# Patient Record
Sex: Female | Born: 1955 | Race: White | Hispanic: No | Marital: Married | Smoking: Never smoker
Health system: Southern US, Community
[De-identification: ages and names within clinical notes are randomized; demographics above are authoritative.]

## PROBLEM LIST (undated history)

## (undated) DIAGNOSIS — T783XXA Angioneurotic edema, initial encounter: Secondary | ICD-10-CM

## (undated) DIAGNOSIS — M1712 Unilateral primary osteoarthritis, left knee: Secondary | ICD-10-CM

## (undated) DIAGNOSIS — R519 Headache, unspecified: Secondary | ICD-10-CM

## (undated) DIAGNOSIS — F419 Anxiety disorder, unspecified: Secondary | ICD-10-CM

## (undated) DIAGNOSIS — F329 Major depressive disorder, single episode, unspecified: Secondary | ICD-10-CM

## (undated) DIAGNOSIS — S83242A Other tear of medial meniscus, current injury, left knee, initial encounter: Secondary | ICD-10-CM

## (undated) DIAGNOSIS — K219 Gastro-esophageal reflux disease without esophagitis: Secondary | ICD-10-CM

## (undated) DIAGNOSIS — G4730 Sleep apnea, unspecified: Secondary | ICD-10-CM

## (undated) DIAGNOSIS — M1990 Unspecified osteoarthritis, unspecified site: Secondary | ICD-10-CM

## (undated) DIAGNOSIS — F32A Depression, unspecified: Secondary | ICD-10-CM

## (undated) DIAGNOSIS — R51 Headache: Secondary | ICD-10-CM

## (undated) HISTORY — PX: TUBAL LIGATION: SHX77

## (undated) HISTORY — DX: Angioneurotic edema, initial encounter: T78.3XXA

## (undated) HISTORY — PX: PLACEMENT OF BREAST IMPLANTS: SHX6334

## (undated) HISTORY — PX: TONSILLECTOMY: SUR1361

---

## 1989-08-01 HISTORY — PX: BREAST EXCISIONAL BIOPSY: SUR124

## 1994-12-01 HISTORY — PX: AUGMENTATION MAMMAPLASTY: SUR837

## 1997-12-01 DIAGNOSIS — T783XXA Angioneurotic edema, initial encounter: Secondary | ICD-10-CM

## 1997-12-01 HISTORY — DX: Angioneurotic edema, initial encounter: T78.3XXA

## 1997-12-01 HISTORY — PX: BACK SURGERY: SHX140

## 1998-05-22 ENCOUNTER — Ambulatory Visit (HOSPITAL_COMMUNITY): Admission: RE | Admit: 1998-05-22 | Discharge: 1998-05-22 | Payer: Self-pay | Admitting: Obstetrics and Gynecology

## 1999-02-27 ENCOUNTER — Ambulatory Visit (HOSPITAL_COMMUNITY): Admission: RE | Admit: 1999-02-27 | Discharge: 1999-02-27 | Payer: Self-pay | Admitting: Neurosurgery

## 1999-02-27 ENCOUNTER — Encounter: Payer: Self-pay | Admitting: Neurosurgery

## 1999-05-28 ENCOUNTER — Encounter: Payer: Self-pay | Admitting: Obstetrics and Gynecology

## 1999-05-28 ENCOUNTER — Ambulatory Visit (HOSPITAL_COMMUNITY): Admission: RE | Admit: 1999-05-28 | Discharge: 1999-05-28 | Payer: Self-pay | Admitting: Obstetrics and Gynecology

## 1999-06-26 ENCOUNTER — Other Ambulatory Visit: Admission: RE | Admit: 1999-06-26 | Discharge: 1999-06-26 | Payer: Self-pay | Admitting: Obstetrics and Gynecology

## 2000-06-16 ENCOUNTER — Encounter: Payer: Self-pay | Admitting: Obstetrics and Gynecology

## 2000-06-16 ENCOUNTER — Ambulatory Visit (HOSPITAL_COMMUNITY): Admission: RE | Admit: 2000-06-16 | Discharge: 2000-06-16 | Payer: Self-pay | Admitting: Obstetrics and Gynecology

## 2000-06-19 ENCOUNTER — Other Ambulatory Visit: Admission: RE | Admit: 2000-06-19 | Discharge: 2000-06-19 | Payer: Self-pay | Admitting: Obstetrics and Gynecology

## 2001-02-10 ENCOUNTER — Encounter: Payer: Self-pay | Admitting: Emergency Medicine

## 2001-02-10 ENCOUNTER — Emergency Department (HOSPITAL_COMMUNITY): Admission: EM | Admit: 2001-02-10 | Discharge: 2001-02-10 | Payer: Self-pay | Admitting: Emergency Medicine

## 2001-07-20 ENCOUNTER — Other Ambulatory Visit: Admission: RE | Admit: 2001-07-20 | Discharge: 2001-07-20 | Payer: Self-pay | Admitting: Obstetrics and Gynecology

## 2002-05-31 ENCOUNTER — Encounter: Payer: Self-pay | Admitting: Obstetrics and Gynecology

## 2002-05-31 ENCOUNTER — Ambulatory Visit (HOSPITAL_COMMUNITY): Admission: RE | Admit: 2002-05-31 | Discharge: 2002-05-31 | Payer: Self-pay | Admitting: Obstetrics and Gynecology

## 2002-07-21 ENCOUNTER — Other Ambulatory Visit: Admission: RE | Admit: 2002-07-21 | Discharge: 2002-07-21 | Payer: Self-pay | Admitting: Obstetrics and Gynecology

## 2003-07-13 ENCOUNTER — Encounter: Payer: Self-pay | Admitting: Obstetrics and Gynecology

## 2003-07-13 ENCOUNTER — Ambulatory Visit (HOSPITAL_COMMUNITY): Admission: RE | Admit: 2003-07-13 | Discharge: 2003-07-13 | Payer: Self-pay | Admitting: Obstetrics and Gynecology

## 2003-07-25 ENCOUNTER — Other Ambulatory Visit: Admission: RE | Admit: 2003-07-25 | Discharge: 2003-07-25 | Payer: Self-pay | Admitting: Obstetrics and Gynecology

## 2004-08-08 ENCOUNTER — Other Ambulatory Visit: Admission: RE | Admit: 2004-08-08 | Discharge: 2004-08-08 | Payer: Self-pay | Admitting: Obstetrics and Gynecology

## 2004-08-16 ENCOUNTER — Encounter: Admission: RE | Admit: 2004-08-16 | Discharge: 2004-08-16 | Payer: Self-pay | Admitting: Obstetrics and Gynecology

## 2005-03-27 ENCOUNTER — Encounter: Admission: RE | Admit: 2005-03-27 | Discharge: 2005-03-27 | Payer: Self-pay | Admitting: Obstetrics and Gynecology

## 2005-04-01 ENCOUNTER — Ambulatory Visit: Payer: Self-pay | Admitting: Internal Medicine

## 2005-07-04 ENCOUNTER — Ambulatory Visit: Payer: Self-pay | Admitting: Internal Medicine

## 2005-09-06 ENCOUNTER — Ambulatory Visit (HOSPITAL_COMMUNITY): Admission: RE | Admit: 2005-09-06 | Discharge: 2005-09-06 | Payer: Self-pay | Admitting: Orthopedic Surgery

## 2005-09-10 ENCOUNTER — Ambulatory Visit (HOSPITAL_COMMUNITY): Admission: RE | Admit: 2005-09-10 | Discharge: 2005-09-10 | Payer: Self-pay | Admitting: Obstetrics and Gynecology

## 2005-09-15 ENCOUNTER — Other Ambulatory Visit: Admission: RE | Admit: 2005-09-15 | Discharge: 2005-09-15 | Payer: Self-pay | Admitting: Obstetrics and Gynecology

## 2005-11-10 ENCOUNTER — Ambulatory Visit (HOSPITAL_COMMUNITY): Admission: RE | Admit: 2005-11-10 | Discharge: 2005-11-10 | Payer: Self-pay | Admitting: Orthopedic Surgery

## 2005-11-10 ENCOUNTER — Ambulatory Visit (HOSPITAL_BASED_OUTPATIENT_CLINIC_OR_DEPARTMENT_OTHER): Admission: RE | Admit: 2005-11-10 | Discharge: 2005-11-10 | Payer: Self-pay | Admitting: Orthopedic Surgery

## 2005-12-01 HISTORY — PX: LATERAL EPICONDYLE RELEASE: SHX1958

## 2006-07-16 ENCOUNTER — Ambulatory Visit: Payer: Self-pay | Admitting: Internal Medicine

## 2006-09-11 ENCOUNTER — Ambulatory Visit (HOSPITAL_COMMUNITY): Admission: RE | Admit: 2006-09-11 | Discharge: 2006-09-11 | Payer: Self-pay | Admitting: Obstetrics and Gynecology

## 2007-08-27 ENCOUNTER — Ambulatory Visit: Payer: Self-pay | Admitting: Internal Medicine

## 2007-09-01 ENCOUNTER — Ambulatory Visit: Payer: Self-pay | Admitting: Pulmonary Disease

## 2007-09-15 ENCOUNTER — Ambulatory Visit (HOSPITAL_COMMUNITY): Admission: RE | Admit: 2007-09-15 | Discharge: 2007-09-15 | Payer: Self-pay | Admitting: Obstetrics and Gynecology

## 2007-09-25 DIAGNOSIS — G2581 Restless legs syndrome: Secondary | ICD-10-CM | POA: Insufficient documentation

## 2007-09-25 DIAGNOSIS — J302 Other seasonal allergic rhinitis: Secondary | ICD-10-CM | POA: Insufficient documentation

## 2007-09-25 DIAGNOSIS — J4530 Mild persistent asthma, uncomplicated: Secondary | ICD-10-CM

## 2007-09-25 DIAGNOSIS — L500 Allergic urticaria: Secondary | ICD-10-CM

## 2007-11-19 ENCOUNTER — Telehealth (INDEPENDENT_AMBULATORY_CARE_PROVIDER_SITE_OTHER): Payer: Self-pay | Admitting: *Deleted

## 2007-12-02 HISTORY — PX: SHOULDER ARTHROSCOPY W/ ROTATOR CUFF REPAIR: SHX2400

## 2007-12-07 ENCOUNTER — Ambulatory Visit: Payer: Self-pay | Admitting: Internal Medicine

## 2008-01-07 ENCOUNTER — Ambulatory Visit: Payer: Self-pay | Admitting: Internal Medicine

## 2008-02-28 ENCOUNTER — Telehealth: Payer: Self-pay | Admitting: Internal Medicine

## 2008-03-02 ENCOUNTER — Telehealth (INDEPENDENT_AMBULATORY_CARE_PROVIDER_SITE_OTHER): Payer: Self-pay | Admitting: *Deleted

## 2008-03-21 ENCOUNTER — Ambulatory Visit (HOSPITAL_COMMUNITY): Admission: RE | Admit: 2008-03-21 | Discharge: 2008-03-21 | Payer: Self-pay | Admitting: Neurosurgery

## 2008-04-07 ENCOUNTER — Telehealth (INDEPENDENT_AMBULATORY_CARE_PROVIDER_SITE_OTHER): Payer: Self-pay | Admitting: *Deleted

## 2008-04-27 ENCOUNTER — Ambulatory Visit (HOSPITAL_COMMUNITY): Admission: RE | Admit: 2008-04-27 | Discharge: 2008-04-27 | Payer: Self-pay | Admitting: Orthopedic Surgery

## 2008-05-15 ENCOUNTER — Encounter: Admission: RE | Admit: 2008-05-15 | Discharge: 2008-08-13 | Payer: Self-pay | Admitting: Orthopedic Surgery

## 2008-08-25 ENCOUNTER — Ambulatory Visit: Payer: Self-pay | Admitting: Internal Medicine

## 2008-09-19 ENCOUNTER — Ambulatory Visit (HOSPITAL_COMMUNITY): Admission: RE | Admit: 2008-09-19 | Discharge: 2008-09-19 | Payer: Self-pay | Admitting: Obstetrics and Gynecology

## 2008-12-01 HISTORY — PX: BUNIONECTOMY: SHX129

## 2008-12-26 ENCOUNTER — Emergency Department (HOSPITAL_COMMUNITY): Admission: EM | Admit: 2008-12-26 | Discharge: 2008-12-26 | Payer: Self-pay | Admitting: Emergency Medicine

## 2008-12-29 ENCOUNTER — Emergency Department (HOSPITAL_COMMUNITY): Admission: EM | Admit: 2008-12-29 | Discharge: 2008-12-29 | Payer: Self-pay | Admitting: Family Medicine

## 2009-01-07 ENCOUNTER — Emergency Department (HOSPITAL_COMMUNITY): Admission: EM | Admit: 2009-01-07 | Discharge: 2009-01-07 | Payer: Self-pay | Admitting: Family Medicine

## 2009-02-02 ENCOUNTER — Ambulatory Visit (HOSPITAL_COMMUNITY): Admission: RE | Admit: 2009-02-02 | Discharge: 2009-02-02 | Payer: Self-pay | Admitting: Gastroenterology

## 2009-02-02 LAB — HM COLONOSCOPY

## 2009-03-06 ENCOUNTER — Telehealth: Payer: Self-pay | Admitting: Internal Medicine

## 2009-08-24 ENCOUNTER — Ambulatory Visit: Payer: Self-pay | Admitting: Internal Medicine

## 2009-08-30 ENCOUNTER — Encounter: Payer: Self-pay | Admitting: Internal Medicine

## 2009-09-03 ENCOUNTER — Telehealth: Payer: Self-pay | Admitting: Adult Health

## 2009-09-24 ENCOUNTER — Encounter: Payer: Self-pay | Admitting: Internal Medicine

## 2009-10-02 ENCOUNTER — Ambulatory Visit (HOSPITAL_COMMUNITY): Admission: RE | Admit: 2009-10-02 | Discharge: 2009-10-02 | Payer: Self-pay | Admitting: Obstetrics and Gynecology

## 2009-11-14 ENCOUNTER — Emergency Department (HOSPITAL_COMMUNITY): Admission: EM | Admit: 2009-11-14 | Discharge: 2009-11-14 | Payer: Self-pay | Admitting: Family Medicine

## 2009-12-01 HISTORY — PX: SHOULDER ARTHROSCOPY W/ SUBACROMIAL DECOMPRESSION AND DISTAL CLAVICLE EXCISION: SHX2401

## 2010-02-20 ENCOUNTER — Encounter: Payer: Self-pay | Admitting: Internal Medicine

## 2010-03-05 ENCOUNTER — Ambulatory Visit: Payer: Self-pay | Admitting: Internal Medicine

## 2010-03-12 ENCOUNTER — Telehealth (INDEPENDENT_AMBULATORY_CARE_PROVIDER_SITE_OTHER): Payer: Self-pay | Admitting: *Deleted

## 2010-03-18 ENCOUNTER — Ambulatory Visit: Payer: Self-pay | Admitting: Internal Medicine

## 2010-03-18 ENCOUNTER — Telehealth (INDEPENDENT_AMBULATORY_CARE_PROVIDER_SITE_OTHER): Payer: Self-pay | Admitting: *Deleted

## 2010-04-24 ENCOUNTER — Telehealth: Payer: Self-pay | Admitting: Internal Medicine

## 2010-05-14 ENCOUNTER — Ambulatory Visit: Payer: Self-pay | Admitting: Internal Medicine

## 2010-05-14 DIAGNOSIS — K219 Gastro-esophageal reflux disease without esophagitis: Secondary | ICD-10-CM

## 2010-05-31 ENCOUNTER — Telehealth (INDEPENDENT_AMBULATORY_CARE_PROVIDER_SITE_OTHER): Payer: Self-pay | Admitting: *Deleted

## 2010-06-10 ENCOUNTER — Telehealth: Payer: Self-pay | Admitting: Internal Medicine

## 2010-06-10 DIAGNOSIS — R05 Cough: Secondary | ICD-10-CM

## 2010-06-14 ENCOUNTER — Ambulatory Visit (HOSPITAL_COMMUNITY): Admission: RE | Admit: 2010-06-14 | Discharge: 2010-06-14 | Payer: Self-pay | Admitting: Internal Medicine

## 2010-07-01 ENCOUNTER — Encounter: Payer: Self-pay | Admitting: Internal Medicine

## 2010-07-01 ENCOUNTER — Ambulatory Visit (HOSPITAL_COMMUNITY): Admission: RE | Admit: 2010-07-01 | Discharge: 2010-07-01 | Payer: Self-pay | Admitting: Internal Medicine

## 2010-07-09 ENCOUNTER — Telehealth: Payer: Self-pay | Admitting: Internal Medicine

## 2010-07-23 ENCOUNTER — Ambulatory Visit (HOSPITAL_COMMUNITY): Admission: RE | Admit: 2010-07-23 | Discharge: 2010-07-23 | Payer: Self-pay | Admitting: Internal Medicine

## 2010-08-20 ENCOUNTER — Encounter: Payer: Self-pay | Admitting: Internal Medicine

## 2010-10-03 ENCOUNTER — Ambulatory Visit (HOSPITAL_COMMUNITY): Admission: RE | Admit: 2010-10-03 | Discharge: 2010-10-03 | Payer: Self-pay | Admitting: Orthopedic Surgery

## 2010-10-10 ENCOUNTER — Ambulatory Visit (HOSPITAL_COMMUNITY): Admission: RE | Admit: 2010-10-10 | Discharge: 2010-10-10 | Payer: Self-pay | Admitting: Obstetrics and Gynecology

## 2010-10-15 ENCOUNTER — Ambulatory Visit (HOSPITAL_COMMUNITY): Payer: Self-pay | Admitting: Psychiatry

## 2010-10-31 ENCOUNTER — Ambulatory Visit (HOSPITAL_COMMUNITY): Payer: Self-pay | Admitting: Marriage and Family Therapist

## 2010-11-06 ENCOUNTER — Ambulatory Visit (HOSPITAL_COMMUNITY): Payer: Self-pay | Admitting: Psychiatry

## 2010-11-18 ENCOUNTER — Ambulatory Visit (HOSPITAL_COMMUNITY): Payer: Self-pay | Admitting: Marriage and Family Therapist

## 2010-11-26 ENCOUNTER — Ambulatory Visit (HOSPITAL_COMMUNITY): Payer: Self-pay | Admitting: Psychiatry

## 2010-11-28 ENCOUNTER — Ambulatory Visit (HOSPITAL_COMMUNITY)
Admission: RE | Admit: 2010-11-28 | Discharge: 2010-11-28 | Payer: Self-pay | Source: Home / Self Care | Attending: Orthopedic Surgery | Admitting: Orthopedic Surgery

## 2010-12-22 ENCOUNTER — Encounter: Payer: Self-pay | Admitting: Internal Medicine

## 2010-12-22 ENCOUNTER — Encounter: Payer: Self-pay | Admitting: Obstetrics and Gynecology

## 2010-12-26 ENCOUNTER — Ambulatory Visit (HOSPITAL_COMMUNITY)
Admission: RE | Admit: 2010-12-26 | Discharge: 2010-12-26 | Payer: Self-pay | Source: Home / Self Care | Attending: Marriage and Family Therapist | Admitting: Marriage and Family Therapist

## 2010-12-27 NOTE — Op Note (Signed)
NAME:  [name], [name]               ACCOUNT NO.:  [id number]  MEDICAL RECORD NO.:  [id number]          PATIENT TYPE:  AMB  LOCATION:  SDS                          FACILITY:  [facility]  PHYSICIAN:  Vania Rea. Supple, M.D.  DATE OF BIRTH:  [birth year]  DATE OF PROCEDURE:  11/28/2010 DATE OF DISCHARGE:  11/28/2010                              OPERATIVE REPORT   PREOPERATIVE DIAGNOSES: 1. Left shoulder chronic impingement syndrome. 2. Left shoulder adhesive capsulitis. 3. Left shoulder acromioclavicular joint arthropathy.  POSTOPERATIVE DIAGNOSES: 1. Left shoulder chronic impingement syndrome. 2. Left shoulder adhesive capsulitis. 3. Left shoulder acromioclavicular joint arthropathy. 4. Complex labral tear. 5. Chondromalacia of the humeral head. 6. Partial articular rotator cuff tear.  PROCEDURES: 1. Left shoulder examination under anesthesia. 2. Left shoulder manipulation under anesthesia. 3. Left shoulder diagnostic arthroscopy. 4. Debridement of complex labral tear. 5. Debridement of partial articular rotator cuff tear. 6. Chondroplasty of the humeral head. 7. Arthroscopic subacromial decompression and bursectomy. 8. Arthroscopic distal clavicle resection.  SURGEON:  Vania Rea. Supple, MD  ASSISTANT:  Lucita Lora. Shuford, PA-C  ANESTHESIA:  General endotracheal as well as an interscalene block.  ESTIMATED BLOOD LOSS:  Minimal.  DRAINS:  None.  HISTORY:  [name] is a 55 year old female who has had progressively increasing left shoulder pain as well as restrictions in mobility and ongoing functional limitations refractory to attempts at conservative management.  Preoperative MRI scan does show changes consistent with adhesive capsulitis.  Due to her ongoing pain and functional limitation, she is brought to the operating at this time for planned left shoulder arthroscopy as described below.  Preoperatively, we counseled [name] on treatment options as well as risks versus  benefits thereof.  Possible surgical complications were reviewed including potential for bleeding, infection, neurovascular injury, periarticular fracture and/or dislocation, anesthetic complication and possible need for additional surgery.  She understands and accepts and agrees with our planned procedure.  PROCEDURE IN DETAIL:  After undergoing routine preop evaluation, the patient received prophylactic antibiotics and an interscalene block was established in the holding area by the Anesthesia Department.  Placed supine on the operating table, underwent smooth induction of general endotracheal anesthesia.  Turned to the right lateral decubitus position on a beanbag and appropriately padded and protected.  Left shoulder examination under anesthesia revealed approximately 160 degrees of forward elevation.  Gentle manipulation was performed with palpable and audible release of adhesions and achieved 180 degrees of forward elevation and abduction and 90 degrees of internal and external rotation.  Left arm suspended at 70 degrees of abduction with 10 pounds traction.  Left shoulder region was sterilely prepped and draped in standard fashion.  Time-out was called.  Posterior portal was established in the glenohumeral joint and anterior portal was established under direct visualization.  The glenohumeral articular surfaces were in overall good condition with the exception of area on the superior aspect of the humeral head, which showed evidence for grade 2 chondromalacia.  It is unclear whether the rotator cuff may have scarred to this area contributing to her restricted mobility prior to the manipulation.  We did perform a chondroplasty of  this area on the superior aspect of the humeral head adjacent to the rotator cuff insertion approximately 2 x 2 cm.  There was also a partial articular rotator cuff tear involving the distal supraspinatus, which corresponding to this area and this was  debrided with shaver and I would estimate corresponded to less than 5% of thickness of the tendon.  The biceps tendon itself was of normal caliber and no evidence for proximal and distal instability.  No instability patterns were noted.  There was an extensive degenerative tear of the superior labrum, anterior superior quadrant, which was debrided with shaver back to stable margin.  The remaining inspection of the glenohumeral joint showed no obvious additional intra-articular pathologies and overall, capsular volume was within normal limits.  Fluid and instruments were then removed.  The arm was dropped down to 30 degrees abduction with the arthroscope introduced into the subacromial space of the posterior portal and a direct lateral portal was established in the subacromial space.  Abundant proliferative bursal tissue was encountered and this was excised with combination of shaver and Stryker wand.  The wand was then used to remove the periosteum from the undersurface of the anterior half of the acromion and the subacromial decompression was performed with a bur removing just 2 mm of bone anteriorly.  A portal was then established directly anterior to the distal clavicle.  The [facility] joint showed significant degenerative change.  So, the distal clavicle resection was performed with a bur and care was taken to confirm visualization of the entire circumference of the distal clavicle to ensure adequate removal of bone. We then completed the subacromial/subdeltoid bursectomy.  Hemostasis was obtained.  The bursal surface of the rotator cuff was carefully inspected and found to be intact.  Final hemostasis was obtained.  Fluid and instruments were removed.  The portals were closed with Monocryl and Steri-Strips.  The bulky dry dressing was then taped at the left shoulder.  The left arm was placed in sling immobilizer.  The patient was then awakened, extubated and taken to recovery room in  stable condition.     Vania Rea. Supple, M.D.     KMS/MEDQ  D:  11/28/2010  T:  11/29/2010  Job:  [id number]  Electronically Signed by Francena Hanly M.D. on 12/25/2010 07:43:36 AM

## 2011-01-02 NOTE — Progress Notes (Signed)
Summary: Discussed Nl PFT/ Mecholyl, CT Chest/Sinus.P: SeeENT, Sched B.S.  Phone Note Call from Patient Call back at [phone]   Caller: Patient Call For: young Reason for Call: Talk to Nurse Summary of Call: pt looking for results of PFT's done at [facility], 07/01/2010.  Also, still have really bad cough.  Would like a call back to discuss these two things. Initial call taken by: Eugene Gavia,  July 09, 2010 1:42 PM  Follow-up for Phone Call        Pt requesting results of PFT. Pt states cough is still no better. Please advise. Thanks. Zackery Barefoot CMA  July 09, 2010 3:04 PM   NKDA  Additional Follow-up for Phone Call Additional follow up Details #1::        I called Mrs Cuddeback. PFT and Mecholyl studies normal/ negative, showing nothing that would explain her cough on basis of reactive airway. Cough comes and goes with no pattern of activity, exposure, in/out of doors, etc. She doesn't recognize postnasal drip reflux. I recommended ENT and barium swallow. She agrees. She has seen Dr Annalee Genta and will call him. We will schedule Bar swallow. Additional Follow-up by: Waymon Budge MD,  July 09, 2010 8:29 PM

## 2011-01-02 NOTE — Assessment & Plan Note (Signed)
Summary: rov/ mbw   Primary Provider/Referring Provider:  Royetta Asal [facility]  CC:  Follow up per pt; recently had bloody nose; allergy trouble. unable to get phelgm up  and has had sore throat and drainage. 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   08/24/09--Presents for yearly follow up - sore throat in the mornings with the season changes but otherwise no complaints.  needs new scripts for advair, allegra, proair and clonazepam. Doing well over last year. Has mild nasal drip, post nasal drainage over last few weeks. Denies chest pain, dyspnea, orthopnea, hemoptysis, fever, n/v/d, edema, headache.   March 05, 2010- Allergic rhinitis, asthma, Restless legs During winter had a sustained epistaxis and sinus drainage.Went to Urgent Care- gave mupiricn. Coughs during the night. Denies reflux. Throat tickle / itch with drainage. Neti pot sometimes helps. Eyes and ears ok.  Uses Advair daily, rescue inhaler rarely.Does use Veramyst.     Current Medications (verified): 1)  Allegra 180 Mg  Tabs (Fexofenadine Hcl) .... Once Daily 2)  Advair Diskus 250-50 Mcg/dose  Misc (Fluticasone-Salmeterol) .Marland Kitchen.. 1 Puff, Twice Daily 3)  Estrogel 0.75 Mg/1.25 Gm (0.06%) Gel (Estradiol) .... Apply Once Daily 4)  Prometrium 100 Mg  Caps (Progesterone Micronized) .... One Once Daily 5)  Clonazepam 0.5 Mg  Tabs (Clonazepam) .... One By Mouth At Bedtime 6)  Xopenex Hfa 45 Mcg/act  Aero (Levalbuterol Tartrate) .... 2 Puffs 4 X Dailyas Needed 7)  Fluoxetine Hcl 10 Mg Caps (Fluoxetine Hcl) .... 3 Capsules A Day Equaling 30mg  Daily 8)  Veramyst 27.5 Mcg/spray Susp (Fluticasone Furoate) .... One Puff Each Nostril Twice Daily 9)  Maxalt 10 Mg Tabs (Rizatriptan Benzoate) .... Take 1 By Mouth 1-2 Times A Week Migraine Onset 10)   Topamax 100 Mg Tabs (Topiramate) .... Take 300mg  At Bedtime  Allergies (verified): No Known Drug Allergies  Past History:  Past Medical History: Last updated: 01/07/2008 Allergic rhinitis- Skin test positive 1991 for common inhalants Urticaria/ angioedema 1999  Past Surgical History: Last updated: 01/07/2008 C-section  Family History: Last updated: 09/15/2008 COPD: father smoked Coronary Heart Disease arthritis   Mother-living age 85; Dem and ETOH abuse, Heart Disease. Father- living age 67; COPD, Emphysema, Arthritis. Sibling 1- living age 33; COPD , FM. Sibling 2- living age 30. Sibling 3- living age 15; Arthiritis. Sibling 4- living age 43; FM.  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  ETOH- 1 weekly  Risk Factors: Smoking Status: never (12/07/2007) Passive Smoke Exposure: yes (09/15/2008)  Review of Systems      See HPI  The patient denies anorexia, fever, weight loss, weight gain, vision loss, decreased hearing, hoarseness, chest pain, syncope, dyspnea on exertion, peripheral edema, prolonged cough, headaches, hemoptysis, abdominal pain, and severe indigestion/heartburn.    Vital Signs:  Patient profile:   55 year old female Height:      69 inches Weight:      174.13 pounds BMI:     25.81 O2 Sat:      97 % on Room air Pulse rate:   83 / minute BP sitting:   118 / 76  (left arm) Cuff size:   regular  Vitals Entered By: Reynaldo Minium CMA (March 05, 2010 3:48 PM)  O2 Flow:  Room air  Physical Exam  Additional Exam:  General: A/Ox3; pleasant and cooperative, NAD, SKIN: no rash, lesions NODES: no lymphadenopathy HEENT: [place]/AT, EOM- WNL, Conjuctivae- clear, PERRLA, TM-WNL, Nose- pale w/ clear discharge, not blocked, Throat- clear and wnl, Mallampati  II, not hoarse and not cobblestoned NECK: Supple w/ fair ROM, JVD- none, normal carotid impulses w/o bruits Thyroid- normal to  palpation CHEST: Clear to P&A HEART: RRR, no m/g/r heard ABDOMEN: Soft and nl;  UJW:JXBJ, nl pulses, no edema  NEURO: Grossly intact to observation      Impression & Recommendations:  Problem # 1:  ALLERGIC RHINITIS (ICD-477.9)  Persistent rhinitis. It began during winter and may be low grade infection rather than allergy.  We will try nasal antihistamine and antibiotic. Her updated medication list for this problem includes:    Allegra 180 Mg Tabs (Fexofenadine hcl) ..... Once daily    Veramyst 27.5 Mcg/spray Susp (Fluticasone furoate) ..... One puff each nostril twice daily  Medications Added to Medication List This Visit: 1)  Maxalt 10 Mg Tabs (Rizatriptan benzoate) .... Take 1 by mouth 1-2 times a week migraine onset 2)  Topamax 100 Mg Tabs (Topiramate) .... Take 300mg  at bedtime 3)  Cefdinir 300 Mg Caps (Cefdinir) .... 2 daily  Other Orders: Est. Patient Level II (47829)  Patient Instructions: 1)  Please schedule a follow-up appointment in 1 year. 2)  Sample Astepro nasal antihistamine spray: 1-2 sprays each nostril up to twice daily as needed. 3)  We will try sample Omnaris: 2 sprays each nostril once daily at bedtime. Then go back to Veramyst. 4)  Script for antibiotic cefdinir sent to [facility] pharmacy Prescriptions: CEFDINIR 300 MG CAPS (CEFDINIR) 2 daily  #14 x 0   Entered and Authorized by:   Waymon Budge MD   Signed by:   Waymon Budge MD on 03/05/2010   Method used:   Electronically to        [facility]* (retail)       [address]       [address] Shipping/mailing       [place], [place]  [address]       Ph: [phone]       Fax: [phone]   RxID:   [id number]

## 2011-01-02 NOTE — Assessment & Plan Note (Signed)
Summary: Acute NP office visit - asthma   Primary Provider/Referring Provider:  Royetta Asal [facility]  CC:  finished zpak saturday but states no better.  still SOB, wheezing, and prod cough with yellow/green/gray mucus x6days total.  pt deines any f/c/s.  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   08/24/09--Presents for yearly follow up - sore throat in the mornings with the season changes but otherwise no complaints.  needs new scripts for advair, allegra, proair and clonazepam. Doing well over last year. Has mild nasal drip, post nasal drainage over last few weeks. Denies chest pain, dyspnea, orthopnea, hemoptysis, fever, n/v/d, edema, headache.   March 05, 2010- Allergic rhinitis, asthma, Restless legs During winter had a sustained epistaxis and sinus drainage.Went to Urgent Care- gave mupiricn. Coughs during the night. Denies reflux. Throat tickle / itch with drainage. Neti pot sometimes helps. Eyes and ears ok.  Uses Advair daily, rescue inhaler rarely.Does use Veramyst.  March 18, 2010--Presents for an acute office visit. Complains of cough , and congestion. She  finished zpak saturday but states no better.  still SOB, wheezing, prod cough with yellow/green/gray mucus x6days total.  Has now finished omnicef and zpack but cough and wheezing persist w/ nasal congestion, stuffiness and drainage. Denies chest pain, dyspnea, orthopnea, hemoptysis, fever, n/v/d, edema, headache. Sore from coughing.    Medications Prior to Update: 1)  Allegra 180 Mg  Tabs (Fexofenadine Hcl) .... Once Daily 2)  Advair Diskus 250-50 Mcg/dose  Misc (Fluticasone-Salmeterol) .Marland Kitchen.. 1 Puff, Twice Daily 3)  Estrogel 0.75 Mg/1.25 Gm (0.06%) Gel (Estradiol) .... Apply Once Daily 4)  Prometrium 100 Mg  Caps (Progesterone Micronized)  .... One Once Daily 5)  Clonazepam 0.5 Mg  Tabs (Clonazepam) .... One By Mouth At Bedtime 6)  Xopenex Hfa 45 Mcg/act  Aero (Levalbuterol Tartrate) .... 2 Puffs 4 X Dailyas Needed 7)  Fluoxetine Hcl 10 Mg Caps (Fluoxetine Hcl) .... 3 Capsules A Day Equaling 30mg  Daily 8)  Veramyst 27.5 Mcg/spray Susp (Fluticasone Furoate) .... One Puff Each Nostril Twice Daily 9)  Maxalt 10 Mg Tabs (Rizatriptan Benzoate) .... Take 1 By Mouth 1-2 Times A Week Migraine Onset 10)  Topamax 100 Mg Tabs (Topiramate) .... Take 300mg  At Bedtime 11)  Zithromax Z-Pak 250 Mg Tabs (Azithromycin) .... 2 Today Then One Daily 12)  Promethazine-Codeine 6.25-10 Mg/42ml Syrp (Promethazine-Codeine) .Marland Kitchen.. 1 Teaspoon Four Times A Day As Needed  Current Medications (verified): 1)  Allegra 180 Mg  Tabs (Fexofenadine Hcl) .... Once Daily 2)  Advair Diskus 250-50 Mcg/dose  Misc (Fluticasone-Salmeterol) .Marland Kitchen.. 1 Puff, Twice Daily 3)  Estrogel 0.75 Mg/1.25 Gm (0.06%) Gel (Estradiol) .... Apply Once Daily 4)  Prometrium 100 Mg  Caps (Progesterone Micronized) .... One Once Daily 5)  Clonazepam 0.5 Mg  Tabs (Clonazepam) .... One By Mouth At Bedtime 6)  Xopenex Hfa 45 Mcg/act  Aero (Levalbuterol Tartrate) .... 2 Puffs 4 X Dailyas Needed 7)  Fluoxetine Hcl 10 Mg Caps (Fluoxetine Hcl) .... 3 Capsules A Day Equaling 30mg  Daily 8)  Veramyst 27.5 Mcg/spray Susp (Fluticasone Furoate) .... One Puff Each Nostril Twice Daily 9)  Maxalt 10 Mg Tabs (Rizatriptan Benzoate) .... Take 1 By Mouth 1-2 Times A Week Migraine Onset 10)  Topamax 100 Mg Tabs (Topiramate) .... Take 300mg  At Bedtime 11)  Zithromax Z-Pak 250 Mg Tabs (Azithromycin) .... 2 Today Then One  Daily 12)  Promethazine-Codeine 6.25-10 Mg/95ml Syrp (Promethazine-Codeine) .Marland Kitchen.. 1 Teaspoon Four Times A Day As Needed  Allergies (verified): No Known Drug Allergies  Past History:  Past Medical History: Last updated: 01/07/2008 Allergic rhinitis- Skin test positive 1991 for common  inhalants Urticaria/ angioedema 1999  Past Surgical History: Last updated: 01/07/2008 C-section  Family History: Last updated: 09/15/2008 COPD: father smoked Coronary Heart Disease arthritis   Mother-living age 67; Dem and ETOH abuse, Heart Disease. Father- living age 39; COPD, Emphysema, Arthritis. Sibling 1- living age 39; COPD , FM. Sibling 2- living age 19. Sibling 3- living age 31; Arthiritis. Sibling 4- living age 53; FM.  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  ETOH- 1 weekly  Risk Factors: Smoking Status: never (12/07/2007) Passive Smoke Exposure: yes (09/15/2008)  Review of Systems      See HPI  Vital Signs:  Patient profile:   55 year old female Height:      69 inches Weight:      175 pounds BMI:     25.94 O2 Sat:      97 % on Room air Temp:     99.1 degrees F oral Pulse rate:   80 / minute BP sitting:   110 / 62  (right arm) Cuff size:   regular  Vitals Entered By: Boone Master CNA (March 18, 2010 11:17 AM)  O2 Flow:  Room air CC: finished zpak saturday but states no better.  still SOB, wheezing, prod cough with yellow/green/gray mucus x6days total.  pt deines any f/c/s Is Patient Diabetic? No Comments Medications reviewed with patient Daytime contact number verified with patient. Boone Master CNA  March 18, 2010 11:18 AM    Physical Exam  Additional Exam:  General: A/Ox3; pleasant and cooperative, NAD, SKIN: no rash, lesions NODES: no lymphadenopathy HEENT: [place]/AT, EOM- WNL, Conjuctivae- clear, PERRLA, TM-WNL, Nose- pale w/ clear discharge, not blocked, Throat- clear and wnl, Mallampati  II, not hoarse and not cobblestoned NECK: Supple w/ fair ROM, JVD- none, normal carotid impulses w/o bruits Thyroid- normal to palpation CHEST: Clear to P&A HEART: RRR, no m/g/r heard ABDOMEN: Soft and nl;  ZOX:WRUE, nl pulses, no edema  NEURO: Grossly intact to  observation      Impression & Recommendations:  Problem # 1:  ASTHMA, INTERMITTENT, MILD (ICD-493.90)  Asthmatic bronchitis flare-slow to resolve.  check xray  REC: xopenex neb  Prednisone taper over next week Mucinex DM two times a day as needed cough/congestion INcrease fluids.  Saline nasal rinses as needed  Add Tylenol or Advil cold/sinus to help with sinus pressure as needed  Please contact office for sooner follow up if symptoms do not improve or worsen   Orders: T-2 View CXR (71020TC) Nebulizer Tx (45409) Est. Patient Level IV (81191)  Medications Added to Medication List This Visit: 1)  Prednisone 10 Mg Tabs (Prednisone) .... 4 tabs for 2 days, then 3 tabs for 2 days, 2 tabs for 2 days, then 1 tab for 2 days, then stop  Complete Medication List: 1)  Allegra 180 Mg Tabs (Fexofenadine hcl) .... Once daily 2)  Advair Diskus 250-50 Mcg/dose Misc (Fluticasone-salmeterol) .Marland Kitchen.. 1 puff, twice daily 3)  Estrogel 0.75 Mg/1.25 Gm (0.06%) Gel (Estradiol) .... Apply once daily 4)  Prometrium 100 Mg Caps (Progesterone micronized) .... One once daily 5)  Clonazepam 0.5 Mg Tabs (Clonazepam) .... One by mouth at bedtime 6)  Xopenex Hfa 45 Mcg/act Aero (Levalbuterol  tartrate) .... 2 puffs 4 x dailyas needed 7)  Fluoxetine Hcl 10 Mg Caps (Fluoxetine hcl) .... 3 capsules a day equaling 30mg  daily 8)  Veramyst 27.5 Mcg/spray Susp (Fluticasone furoate) .... One puff each nostril twice daily 9)  Maxalt 10 Mg Tabs (Rizatriptan benzoate) .... Take 1 by mouth 1-2 times a week migraine onset 10)  Topamax 100 Mg Tabs (Topiramate) .... Take 300mg  at bedtime 11)  Zithromax Z-pak 250 Mg Tabs (Azithromycin) .... 2 today then one daily 12)  Promethazine-codeine 6.25-10 Mg/78ml Syrp (Promethazine-codeine) .Marland Kitchen.. 1 teaspoon four times a day as needed 13)  Prednisone 10 Mg Tabs (Prednisone) .... 4 tabs for 2 days, then 3 tabs for 2 days, 2 tabs for 2 days, then 1 tab for 2 days, then stop  Patient  Instructions: 1)  I will call cell [phone]  with xray results.  2)  Prednisone taper over next week 3)  Mucinex DM two times a day as needed cough/congestion 4)  INcrease fluids.  5)  Saline nasal rinses as needed  6)  Add Tylenol or Advil cold/sinus to help with sinus pressure as needed  7)  Please contact office for sooner follow up if symptoms do not improve or worsen  Prescriptions: PREDNISONE 10 MG TABS (PREDNISONE) 4 tabs for 2 days, then 3 tabs for 2 days, 2 tabs for 2 days, then 1 tab for 2 days, then stop  #20 x 0   Entered and Authorized by:   Rubye Oaks NP   Signed by:   Rubye Oaks NP on 03/18/2010   Method used:   Electronically to        [facility]* (retail)       [address]       [address] [place] [address]/mailing       [place], [place]  [address]       Ph: [phone]       Fax: [phone]   RxID:   [id number]    Immunization History:  Influenza Immunization History:    Influenza:  historical (10/01/2009)

## 2011-01-02 NOTE — Assessment & Plan Note (Signed)
Summary: PERSISTANT COUGH///kp   Primary Provider/Referring Provider:  Royetta Asal [facility]  CC:  Persistant cough since April-non productive; tightness in chest and slight wheezing at times. .  History of Present Illness: 55 yo woman with known history of  allergic rhinitis and asthma.   08/24/09--Presents for yearly follow up - sore throat in the mornings with the season changes but otherwise no complaints.  needs new scripts for advair, allegra, proair and clonazepam. Doing well over last year. Has mild nasal drip, post nasal drainage over last few weeks. Denies chest pain, dyspnea, orthopnea, hemoptysis, fever, n/v/d, edema, headache.   March 05, 2010- Allergic rhinitis, asthma, Restless legs During winter had a sustained epistaxis and sinus drainage.Went to Urgent Care- gave mupiricn. Coughs during the night. Denies reflux. Throat tickle / itch with drainage. Neti pot sometimes helps. Eyes and ears ok.  Uses Advair daily, rescue inhaler rarely.Does use Veramyst.  March 18, 2010--Presents for an acute office visit. Complains of cough , and congestion. She  finished zpak saturday but states no better.  still SOB, wheezing, prod cough with yellow/green/gray mucus x6days total.  Has now finished omnicef and zpack but cough and wheezing persist w/ nasal congestion, stuffiness and drainage. Denies chest pain, dyspnea, orthopnea, hemoptysis, fever, n/v/d, edema, headache. Sore from coughing.   May 14, 2010- Allergic rhinitis, asthma, Restless legs. Complains of unusually long course of persistent dry cough, chest tightness with some wheeze. Started with a viral syndrome bronchitis. Benzonatate no help. Finished prednisone taper. Postnasal drip stopped. Does note reflux or something "stuck" in mid chest without food hangup.. Began Pepcid. Not using Xopenex- unhelpful.    Asthma History    Initial Asthma Severity Rating:    Age range: 12+ years    Symptoms: daily    Nighttime Awakenings:  0-2/month    Interferes w/ normal activity: minor limitations    SABA use (not for EIB): 0-2 days/week    Asthma Severity Assessment: Moderate Persistent   Preventive Screening-Counseling & Management  Alcohol-Tobacco     Smoking Status: never  Current Medications (verified): 1)  Allegra 180 Mg  Tabs (Fexofenadine Hcl) .... Once Daily 2)  Advair Diskus 250-50 Mcg/dose  Misc (Fluticasone-Salmeterol) .Marland Kitchen.. 1 Puff, Twice Daily 3)  Estrogel 0.75 Mg/1.25 Gm (0.06%) Gel (Estradiol) .... Apply Once Daily 4)  Prometrium 100 Mg  Caps (Progesterone Micronized) .... One Once Daily 5)  Clonazepam 0.5 Mg  Tabs (Clonazepam) .... One By Mouth At Bedtime 6)  Xopenex Hfa 45 Mcg/act  Aero (Levalbuterol Tartrate) .... 2 Puffs 4 X Dailyas Needed 7)  Fluoxetine Hcl 10 Mg Caps (Fluoxetine Hcl) .... 3 Capsules A Day Equaling 30mg  Daily 8)  Veramyst 27.5 Mcg/spray Susp (Fluticasone Furoate) .... One Puff Each Nostril Twice Daily 9)  Maxalt 10 Mg Tabs (Rizatriptan Benzoate) .... Take 1 By Mouth 1-2 Times A Week Migraine Onset 10)  Topamax 100 Mg Tabs (Topiramate) .... Take 300mg  At Bedtime 11)  Promethazine-Codeine 6.25-10 Mg/31ml Syrp (Promethazine-Codeine) .Marland Kitchen.. 1 Teaspoon Four Times A Day As Needed 12)  Benzonatate 200 Mg Caps (Benzonatate) .... Take 1 Capsule By Mouth Three Times A Day As Needed Cough  Allergies (verified): No Known Drug Allergies  Past History:  Past Medical History: Last updated: 01/07/2008 Allergic rhinitis- Skin test positive 1991 for common inhalants Urticaria/ angioedema 1999  Past Surgical History: Last updated: 01/07/2008 C-section  Family History: Last updated: 09/15/2008 COPD: father smoked Coronary Heart Disease arthritis   Mother-living age 61; Dem and ETOH abuse, Heart  Disease. Father- living age 18; COPD, Emphysema, Arthritis. Sibling 1- living age 75; COPD , FM. Sibling 2- living age 56. Sibling 3- living age 77; Arthiritis. Sibling 4- living age 34;  FM.  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  ETOH- 1 weekly  Risk Factors: Smoking Status: never (05/14/2010) Passive Smoke Exposure: yes (09/15/2008)  Review of Systems      See HPI       The patient complains of non-productive cough and acid heartburn.  The patient denies shortness of breath with activity, shortness of breath at rest, productive cough, coughing up blood, chest pain, irregular heartbeats, indigestion, loss of appetite, weight change, abdominal pain, difficulty swallowing, sore throat, tooth/dental problems, headaches, nasal congestion/difficulty breathing through nose, and sneezing.    Vital Signs:  Patient profile:   55 year old female Height:      69 inches Weight:      171 pounds BMI:     25.34 O2 Sat:      98 % on Room air Pulse rate:   86 / minute BP sitting:   116 / 70  (left arm) Cuff size:   regular  Vitals Entered By: Reynaldo Minium CMA (May 14, 2010 2:44 PM)  O2 Flow:  Room air CC: Persistant cough since April-non productive; tightness in chest and slight wheezing at times.    Physical Exam  Additional Exam:  General: A/Ox3; pleasant and cooperative, NAD, SKIN: no rash, lesions NODES: no lymphadenopathy HEENT: [place]/AT, EOM- WNL, Conjuctivae- clear, PERRLA, TM-WNL, Nose- pale w/ clear discharge, not blocked, Throat- clear and wnl, Mallampati  II, not hoarse and not cobblestoned NECK: Supple w/ fair ROM, JVD- none, normal carotid impulses w/o bruits Thyroid- normal to palpation CHEST: Clear to P&A, but hard cough after deep breath. HEART: RRR, no m/g/r heard ABDOMEN: Soft and nl;  UEA:VWUJ, nl pulses, no edema  NEURO: Grossly intact to observation      Impression & Recommendations:  Problem # 1:  ASTHMA, INTERMITTENT, MILD (ICD-493.90) Asthmatic bronchitis. Cough multifactorial . Will try Singulair plus increased Advair through her vacation next  week.  Problem # 2:  GERD (ICD-530.81)  We discussed this as contributory. Suggested elevating HOB and taking Pepcid two times a day before meals.  Other Orders: Est. Patient Level III (81191)  Patient Instructions: 1)  Please schedule a follow-up appointment in 3 months. 2)  Try elevating head of bed with a brick under each head leg 3)  Take Pepcid twice daily before meals 4)  Try samples Singulair one daily x 2 weeks 5)  Try samples x 2 Advair 500/50- 1 puff twice daily and rinse mouth.

## 2011-01-02 NOTE — Progress Notes (Signed)
Summary: rx LMTCB x 1  Phone Note Call from Patient Call back at [phone]   Caller: Patient Call For: young Reason for Call: Talk to Nurse Summary of Call: coughing, chest tight, wheezing, yello/green plegm, know what she has - can you call in z-pac and cough med w/ codeine in it. [facility] outpatient pharmacy Initial call taken by: Eugene Gavia,  March 12, 2010 10:23 AM  Follow-up for Phone Call        called above number - [facility] Crystal Yetta Barre RN  March 12, 2010 10:36 AM   Additional Follow-up for Phone Call Additional follow up Details #1::        Pt c/o productive cough with yellow to green mucus, wheezing, chest tightness, cough waking her up at night x 3 days, pt denies fever. Pt requesting zpack and cough medication with codeine be sent to [facility] outpt pharmacy. Please advise. Zackery Barefoot CMA  March 12, 2010 11:26 AM   NKDA    Additional Follow-up for Phone Call Additional follow up Details #2::    OK to send 1) Z pak 2) prometh codeine I have put these on her medl list. Follow-up by: Waymon Budge MD,  March 12, 2010 1:35 PM  Additional Follow-up for Phone Call Additional follow up Details #3:: Details for Additional Follow-up Action Taken: called and spoke with pt. pt aware rx sent to pharmacy. Aundra Millet Reynolds LPN  March 12, 2010 1:37 PM   New/Updated Medications: ZITHROMAX Z-PAK 250 MG TABS (AZITHROMYCIN) 2 today then one daily PROMETHAZINE-CODEINE 6.25-10 MG/5ML SYRP (PROMETHAZINE-CODEINE) 1 teaspoon four times a day as needed Prescriptions: PROMETHAZINE-CODEINE 6.25-10 MG/5ML SYRP (PROMETHAZINE-CODEINE) 1 teaspoon four times a day as needed  #200 ml x 1   Entered by:   Arman Filter LPN   Authorized by:   Waymon Budge MD   Signed by:   Arman Filter LPN on 30/16/0109   Method used:   Telephoned to ...       [facility]* (retail)       [address]       [address] Shipping/mailing       [place], [place]  [address]       Ph:  [phone]       Fax: [phone]   RxID:   [id number] ZITHROMAX Z-PAK 250 MG TABS (AZITHROMYCIN) 2 today then one daily  #1 pak x 0   Entered by:   Arman Filter LPN   Authorized by:   Waymon Budge MD   Signed by:   Arman Filter LPN on 48/54/6270   Method used:   Telephoned to ...       [facility]* (retail)       [address]       [address] Shipping/mailing       [place], [place]  [address]       Ph: [phone]       Fax: [phone]   RxID:   [id number] PROMETHAZINE-CODEINE 6.25-10 MG/5ML SYRP (PROMETHAZINE-CODEINE) 1 teaspoon four times a day as needed  #200 ml x 1   Entered by:   Waymon Budge MD   Authorized by:   Pulmonary Triage   Signed by:   Waymon Budge MD on 03/12/2010   Method used:   Historical   RxID:   [id number] ZITHROMAX Z-PAK 250 MG TABS (AZITHROMYCIN) 2 today then one daily  #1 pak x 0   Entered by:  Waymon Budge MD   Authorized by:   Pulmonary Triage   Signed by:   Waymon Budge MD on 03/12/2010   Method used:   Historical   RxID:   [id number]

## 2011-01-02 NOTE — Letter (Signed)
Summary: [facility]  [facility]   Imported By: Lester Rockford 08/26/2010 10:34:51  _____________________________________________________________________  External Attachment:    Type:   Image     Comment:   External Document

## 2011-01-02 NOTE — Progress Notes (Signed)
Summary: CONGESTED/ SOB  Phone Note Call from Patient Call back at [phone]   Caller: Patient Call For: YOUNG Summary of Call: PT STILL COUGHING SOB CONGESTED AND CHEST PAIN NEED TO KNOW IF SHE NEED TO COME IN FOR OFFICE VISIT  Initial call taken by: Rickard Patience,  March 18, 2010 10:06 AM  Follow-up for Phone Call        Pt was given zpak and prometh codeine cough syrup on 4/12 for these symptoms.  Called, spoke with pt.  Pt states she is no better.  Has finished zpak.  Still having congestion, prod cough with yellow and green mucus, wheezing, SOB at rest and with activity, has no energy, and pain in right back area.  Requesting OV.  OV scheduled with TP for today at 11:00 - pt aware.   Follow-up by: Gweneth Dimitri RN,  March 18, 2010 10:24 AM

## 2011-01-02 NOTE — Progress Notes (Signed)
Summary: cough  Phone Note Call from Patient Call back at [phone]   Caller: Patient Call For: Malayja Freund Summary of Call: pt states that cough isn't any better would like to talk to dr Arvetta Araque  Initial call taken by: Rickard Patience,  June 10, 2010 11:38 AM  Follow-up for Phone Call        pt very frustrated that cough is no better, she states coughing is just making her very concerned that something else is going on, it is constant and she is not sleeping, feeling very tired during the day.  Pt would like to speak with dr Susi Goslin directly, willing to wait until he is done with patient's  Follow-up by: Philipp Deputy CMA,  June 10, 2010 2:13 PM  Additional Follow-up for Phone Call Additional follow up Details #1::        Done Additional Follow-up by: Waymon Budge MD,  June 11, 2010 1:23 PM

## 2011-01-02 NOTE — Miscellaneous (Signed)
Summary: Clonazepam Rx   Clinical Lists Changes  Medications: Rx of CLONAZEPAM 0.5 MG  TABS (CLONAZEPAM) one by mouth at bedtime;  #90 x 0;  Signed;  Entered by: Gweneth Dimitri RN;  Authorized by: Waymon Budge MD;  Method used: Historical    Prescriptions: CLONAZEPAM 0.5 MG  TABS (CLONAZEPAM) one by mouth at bedtime  #90 x 0   Entered by:   Gweneth Dimitri RN   Authorized by:   Waymon Budge MD   Signed by:   Gweneth Dimitri RN on 02/20/2010   Method used:   Historical   RxID:   [id number]  received rx request from [facility] to refill med for pt.  Ok per Cy to refill med as stated above-#90 with no additional refills.  Per CY, refill this time only and pt needs to keep scheduled follow up appt for April.  Refill Request signed by CY and faxed back to [facility] at [phone]. Gweneth Dimitri RN  February 20, 2010 3:34 PM

## 2011-01-02 NOTE — Progress Notes (Signed)
Summary: rx   Phone Note Call from Patient Call back at [phone]   Caller: Patient Call For: young Reason for Call: Talk to Nurse Summary of Call: pt still coughing, real irritating, same symtoms as before.  Feels like she may need another round of antibiotis.  PLease respond. [facility] Initial call taken by: Eugene Gavia,  May 31, 2010 9:16 AM  Follow-up for Phone Call        [facility] .Kandice Hams CMA  May 31, 2010 10:05 AM Pt called  seen 2 wks ago given samples of singlulair and Advair 500/50, finished on tue, meds did help some but signs did not completly go away --Continous cough non productive is back, chest tightness, raspy voice, no wheezing --pt is using pepcid which is helping, elevating HOB did not help much  Please advise .Kandice Hams CMA  May 31, 2010 11:12 AM      Additional Follow-up for Phone Call Additional follow up Details #1::        Offer cough syrup: hydromet- 1 teaspoon q 6 h as needed, 250 ml, no refill. Continue Pepcid Offer sample Dulera 200/5, 2 puffs and rinse well twice daily. Offer Biaxin 500 mg, 1 after meals two times a day . # 14 Additional Follow-up by: Waymon Budge MD,  May 31, 2010 2:20 PM    Additional Follow-up for Phone Call Additional follow up Details #2::    Spoke with pt and advised that she should stay on pepcid and that we are calling in rxs for hydromet and biaxin.  Also, advised we will leave a sample of dulera for her to try 2 piffs bid.  Advised that she be sure and rinse mouth well after use.  Pt verbalized understanding. Follow-up by: Vernie Murders,  May 31, 2010 2:36 PM  New/Updated Medications: BIAXIN 500 MG TABS (CLARITHROMYCIN) 1 by mouth after meal two times a day until gone HYDROMET 5-1.5 MG/5ML SYRP (HYDROCODONE-HOMATROPINE) 1 tsp every 6 hours as needed for cough Prescriptions: HYDROMET 5-1.5 MG/5ML SYRP (HYDROCODONE-HOMATROPINE) 1 tsp every 6 hours as needed for cough  #250 x 0   Entered by:    Vernie Murders   Authorized by:   Waymon Budge MD   Signed by:   Vernie Murders on 05/31/2010   Method used:   Telephoned to ...       [facility]* (retail)       [address]       [address] Shipping/mailing       [place], [place]  [address]       Ph: [phone]       Fax: [phone]   RxID:   [id number] BIAXIN 500 MG TABS (CLARITHROMYCIN) 1 by mouth after meal two times a day until gone  #14 x 0   Entered by:   Vernie Murders   Authorized by:   Waymon Budge MD   Signed by:   Vernie Murders on 05/31/2010   Method used:   Telephoned to ...       [facility]* (retail)       [address]       [address] Shipping/mailing       [place], [place]  [address]       Ph: [phone]       Fax: [phone]   RxID:   [id number]

## 2011-01-02 NOTE — Medication Information (Signed)
Summary: Clonazepam/[facility]  Clonazepam/[facility]   Imported By: Sherian Rein 02/27/2010 10:10:48  _____________________________________________________________________  External Attachment:    Type:   Image     Comment:   External Document

## 2011-01-02 NOTE — Progress Notes (Signed)
Summary: still sick -  Phone Note Call from Patient Call back at [phone]   Caller: Patient Call For: Bracen Schum Reason for Call: Talk to Nurse Summary of Call: have a persistant cough, can't sheke.  Have some Gerd symptoms.  Got an rx for codeine cough med, but can't work with it.  Can you give her something else to take? [facility] Initial call taken by: Eugene Gavia,  Apr 24, 2010 11:08 AM  Follow-up for Phone Call        pt states she ahs been treated with 2 round of abx, prednisone and nasal sprays for bronchitis in month of April and she is much better, but she still has a chronic dry cough left that is bothering her.  She also states she is having some GERD symptoms and not sure if this is causing the cough. She has cough med that she uses at night to help with cough but she cannot use this during the day because it makes her too sleepy. Please advise. Carron Curie CMA  Apr 24, 2010 11:22 AM allergies: NKDA  Additional Follow-up for Phone Call Additional follow up Details #1::        Per CDY- try benzonatate 200mg  # 30 take 1 by mouth three times a day as needed cough no refills. Also, try aggrexive OTC antireflux like pepcid two times a day before meals.Reynaldo Minium CMA  Apr 24, 2010 2:07 PM     Additional Follow-up for Phone Call Additional follow up Details #2::    ATC pt at number provided, LMOM TCB.  benzonatate rx sent to pt's pharmacy of choice. Boone Master CNA/MA  Apr 24, 2010 2:37 PM  pt aware of recs and rx. Carron Curie CMA  Apr 25, 2010 9:23 AM    New/Updated Medications: BENZONATATE 200 MG CAPS (BENZONATATE) Take 1 capsule by mouth three times a day as needed cough Prescriptions: BENZONATATE 200 MG CAPS (BENZONATATE) Take 1 capsule by mouth three times a day as needed cough  #30 x 0   Entered by:   Boone Master CNA/MA   Authorized by:   Waymon Budge MD   Signed by:   Boone Master CNA/MA on 04/24/2010   Method used:   Electronically  to        [facility]* (retail)       [address]       [address] Shipping/mailing       [place], [place]  [address]       Ph: [phone]       Fax: [phone]   RxID:   [id number]

## 2011-01-02 NOTE — Progress Notes (Signed)
Summary: Persistent cough, no improving.  Phone Note Other Incoming   Summary of Call: She is getting frustrated that she never feels well, with persistent cough. She feels this has been going on for months. Wearing down and not getting better. Saying that, I hear no asthma, cough or wheeze tonight. She has had 10 days of Dulera without noticing beneit. I discussed and will send for second opinion if needed.  We will start wih CT chest and sinuses, PFT. Initial call taken by: Waymon Budge MD,  June 10, 2010 9:08 PM  Follow-up for Phone Call        [facility] (work) for pt to return my call to schedule CT Chest, Ltd CT of sinueses and PFT. Waiting on pt to return my call. Rhonda Cobb  June 11, 2010 12:03 PM Pt requests that all test be scheduled at [facility].  1) CT Chest with contrast and CT Sinus Ltd scheduled for Friday 06/14/10 at 12:00 arrival time. Pt to check in at 1st floor admissions. No food 4 hours prior to test. 2) Full PFT scheduled at [facility] for Monday 06/17/10 at 1:00. Pt to register at 1st floor admissions at 12:45. Instructions left on vocie mail at pt's work number along with appt date time and location. Pt advised if she had any questions, to call me back at [phone]. Rhonda Cobb  June 12, 2010 10:15 AM   New Problems: COUGH (ICD-786.2)   New Problems: COUGH (ICD-786.2)

## 2011-01-07 ENCOUNTER — Encounter (HOSPITAL_COMMUNITY): Payer: 59 | Admitting: Physician Assistant

## 2011-01-07 DIAGNOSIS — F309 Manic episode, unspecified: Secondary | ICD-10-CM

## 2011-02-05 ENCOUNTER — Encounter (HOSPITAL_COMMUNITY): Payer: 59 | Admitting: Physician Assistant

## 2011-02-10 LAB — PROTIME-INR
INR: 1.02 (ref 0.00–1.49)
Prothrombin Time: 13.6 seconds (ref 11.6–15.2)

## 2011-02-10 LAB — COMPREHENSIVE METABOLIC PANEL
ALT: 15 U/L (ref 0–35)
AST: 16 U/L (ref 0–37)
Albumin: 3.8 g/dL (ref 3.5–5.2)
CO2: 21 mEq/L (ref 19–32)
Calcium: 9.2 mg/dL (ref 8.4–10.5)
Creatinine, Ser: 0.75 mg/dL (ref 0.4–1.2)
GFR calc Af Amer: >60 mL/min (ref >60)
Sodium: 137 mEq/L (ref 135–145)
Total Bilirubin: 0.2 mg/dL — ABNORMAL LOW (ref 0.3–1.2)

## 2011-02-10 LAB — SURGICAL PCR SCREEN
MRSA, PCR: NEGATIVE
Staphylococcus aureus: NEGATIVE

## 2011-02-10 LAB — CBC
Hemoglobin: 12.7 g/dL (ref 12.0–15.0)
MCH: 28.8 pg (ref 26.0–34.0)
MCHC: 32.5 g/dL (ref 30.0–36.0)
Platelets: 327 10*3/uL (ref 150–400)
RBC: 4.41 MIL/uL (ref 3.87–5.11)

## 2011-02-10 LAB — URINALYSIS, ROUTINE W REFLEX MICROSCOPIC
Bilirubin Urine: NEGATIVE
Hgb urine dipstick: NEGATIVE
Ketones, ur: NEGATIVE mg/dL
Protein, ur: NEGATIVE mg/dL
Urobilinogen, UA: 0.2 mg/dL (ref 0.0–1.0)

## 2011-02-20 ENCOUNTER — Encounter (HOSPITAL_COMMUNITY): Payer: 59 | Admitting: Physician Assistant

## 2011-03-13 ENCOUNTER — Encounter (HOSPITAL_COMMUNITY): Payer: 59 | Admitting: Physician Assistant

## 2011-03-13 DIAGNOSIS — F329 Major depressive disorder, single episode, unspecified: Secondary | ICD-10-CM

## 2011-03-17 LAB — POCT URINALYSIS DIP (DEVICE)
Bilirubin Urine: NEGATIVE
Bilirubin Urine: NEGATIVE
Glucose, UA: NEGATIVE mg/dL
Hgb urine dipstick: NEGATIVE
Ketones, ur: NEGATIVE mg/dL
Nitrite: POSITIVE — AB
Specific Gravity, Urine: >=1.03 (ref 1.005–1.030)
pH: 7.5 (ref 5.0–8.0)

## 2011-03-17 LAB — URINE CULTURE: Colony Count: NO GROWTH

## 2011-03-18 LAB — POCT URINALYSIS DIP (DEVICE)
Bilirubin Urine: NEGATIVE
Glucose, UA: NEGATIVE mg/dL
Ketones, ur: NEGATIVE mg/dL
pH: 6 (ref 5.0–8.0)

## 2011-04-15 NOTE — Assessment & Plan Note (Signed)
[facility]                             PULMONARY OFFICE NOTE   Lauren, Nguyen                      MRN:          [id number]  DATE:09/01/2007                            DOB:          1956-07-29    HISTORY OF PRESENT ILLNESS:  The patient is a 55 year old white female  patient of Dr. Roxy Cedar who has a known history of asthma, allergic  rhinitis, who presents today for persistent cough and congestion.  The  patient was seen 5 days ago for an acute bronchitic flare.  She was  given a Z-Pak, Depo 40 mg, and Xopenex nebulizer treatment.  The patient  reports her symptoms have not improved and continues to cough up thick  mucus, and feels extremely fatigued and tired with chest tightness.  The  patient denies any exertional chest pain, palpitations, hemoptysis,  orthopnea, PND, or leg swelling.   PAST MEDICAL HISTORY:  Reviewed.   CURRENT MEDICATIONS:  Reviewed.   PHYSICAL EXAM:  The patient is a pleasant female in no acute distress.  She is afebrile with stable vital signs.  O2 saturation is 100% on room  air.  HEENT:  Nasal mucosa is with some mild erythema on the right.  Nontender  sinuses.  Conjunctivae not injected.  TMs clear normal.  Posterior  oropharynx is clear.  NECK:  Supple without cervical adenopathy.  Negative nuchal rigidity.  LUNGS:  Sounds are clear bilaterally without any wheezing or crackles.  CARDIAC:  S1, S2 without murmur, rub, or gallop.  ABDOMEN:  Soft and nontender.  No palpable hepatosplenomegaly.  EXTREMITIES:  Warm without any calf cyanosis, clubbing, or edema.   IMPRESSION AND PLAN:  Slow-to-resolve asthmatic bronchitic exacerbation.  The patient was given Xopenex nebulizer treatment today in the office.  Chest x-ray is pending.  Avelox 400 mg daily x7 days.  Recommend Mucinex  DM or Robitussin DM as needed for cough and congestion.  The patient is  to return back with Dr. Maple Hudson as scheduled or sooner if needed.   The  patient is to contact the office for sooner followup if symptoms do not  improve or worsen.      Rubye Oaks, NP  Electronically Signed      Clinton D. Maple Hudson, MD, Tonny Bollman, FACP  Electronically Signed   TP/MedQ  DD: 09/01/2007  DT: 09/01/2007  Job #: [id number]

## 2011-04-15 NOTE — Assessment & Plan Note (Signed)
[name]                             PULMONARY OFFICE NOTE   [name], [name]                      MRN:          [id number]  DATE:08/27/2007                            DOB:          1956-06-25    PROBLEM LIST:  1. Allergic rhinitis.  2. Urticaria.  3. Mild intermittent asthma.  4. Restless legs.   HISTORY:  A 5-day history of increasing head and chest congestion.  Has  had to miss the last 2 days of work.  Chest feels tight, coughing green  sputum.  Feels weak.  No definite fever.  No GI upset.   MEDICATIONS:  1. Allegra 180 mg.  2. Advair 100/50.  3. Estrogen gel.  4. Prometrium.  5. Klonopin 0.5 mg at bedtime.  6. Albuterol HFA.   No medication allergy.   OBJECTIVE:  Weight 175 pounds, BP 110/72, pulse 86, room air saturation  97%.  Mild bilateral rhonchi.  Work of breathing is not increased.  I find no  adenopathy.  Pharynx is somewhat reddened.  There is turbinate edema.   IMPRESSION:  Rhinitis and bronchitis consistent with a viral syndrome.   PLAN:  Mucinex, fluids and rest.  Z-Pak.  Depo-Medrol 40 mg IM.  Phenergan with codeine 300 mL 5 mL q.6h p.r.n. occasional use is  discussed.  Nebulized Xopenex 1.25 mg.  Schedule return in 1 year,  earlier p.r.n.     Clinton D. Maple Hudson, MD, Tonny Bollman, FACP  Electronically Signed    CDY/MedQ  DD: 08/27/2007  DT: 08/28/2007  Job #: [id number]   cc:   Joycelyn Rua, M.D.

## 2011-04-15 NOTE — Op Note (Signed)
NAME:  [name], [name]               ACCOUNT NO.:  [id number]   MEDICAL RECORD NO.:  [id number]          PATIENT TYPE:  AMB   LOCATION:  ENDO                         FACILITY:  [facility]   PHYSICIAN:  Petra Kuba, M.D.    DATE OF BIRTH:  1956/05/09   DATE OF PROCEDURE:  02/02/2009  DATE OF DISCHARGE:                               OPERATIVE REPORT   PROCEDURE:  Colonoscopy.   INDICATIONS:  Screening.  Consent was signed after risks, benefits,  methods, and options were thoroughly discussed in the office with my  nurse and with me prior to procedure.   MEDICINES USED:  Fentanyl 125 mcg, Versed 12.5 mg.   PROCEDURE:  Rectal inspection is pertinent for tiny external  hemorrhoids.  Digital exam was negative with moderate amount of  difficulty due to a tortuous somewhat looping colon.  With rolling her  on her back and abdominal pressure, we were able to advance to the  cecum.  On insertion, a rare left-sided diverticuli were seen but  no  other abnormalities.  Cecum was identified by the appendiceal orifice  and ileocecal valve.  In fact, the scope was inserted a short way in the  terminal ileum which was normal.  Photodocumentation was obtained.  The  colonoscope was slowly withdrawn.  The prep was adequate.  There was  minimal liquid stool that required washing and suction but on slow  withdrawal through colon, we felt back around the tortuous curve.  We  did try to readvanced around the curve to decrease chance of missing  things.  Colonoscope was drawn back through the rectum.  Other than the  rare sigmoid diverticuli mentioned above, no polyps, tumors, or masses  or other abnormalities were seen.  Once back in the rectum, anorectal  pull-through and retroflexion revealed some tiny hemorrhoids.  The scope  was drained, the air was suctioned and scope removed.  The patient  tolerated the procedure well.  There was no obvious immediate  complication.   ENDOSCOPIC DIAGNOSES:  1.  Internal and external tiny hemorrhoids.  2. Rare sigmoid diverticuli.  3. Tortuous colon.  4. Otherwise, within normal limits terminal ileum.   PLAN:  Happy to see back p.r.n.  Return care to Dr.'s Marcelle Overlie and Lenise Arena  for the customary health care, screening, and maintenance.  Recheck  colon screening in 10 years.  Considering virtual colonoscopy or  possibly even Diprivan in the future.           ______________________________  Petra Kuba, M.D.    MEM/MEDQ  D:  02/02/2009  T:  02/03/2009  Job:  [id number]   cc:   Joycelyn Rua, M.D.  Richard M. Marcelle Overlie, M.D.

## 2011-04-15 NOTE — Op Note (Signed)
NAME:  [name], [name]               ACCOUNT NO.:  [id number]   MEDICAL RECORD NO.:  [id number]          PATIENT TYPE:  AMB   LOCATION:  SDS                          FACILITY:  [facility]   PHYSICIAN:  Vania Rea. Supple, M.D.  DATE OF BIRTH:  [birth year]   DATE OF PROCEDURE:  04/27/2008  DATE OF DISCHARGE:  04/27/2008                               OPERATIVE REPORT   PREOPERATIVE DIAGNOSES:  1. Chronic right shoulder impingement syndrome.  2. Right shoulder rotator cuff tear.  3. Right shoulder acromioclavicular joint arthropathy.   POSTOPERATIVE DIAGNOSES:  1. Chronic right shoulder impingement syndrome.  2. Right shoulder rotator cuff tear.  3. Right shoulder acromioclavicular joint arthropathy.  4. Extensive degenerative labral tear.   PROCEDURES:  1. Right shoulder examination under anesthesia.  2. Right shoulder diagnostic arthroscopy.  3. Debridement of extensive degenerative labral tear.  4. Arthroscopic subacromial decompression with bursectomy.  5. Arthroscopic distal clavicle resection.  6. Arthroscopic rotator cuff repair utilizing a double-row suture-      bridge repair concept.   SURGEON:  Vania Rea. Supple, MD   ASSISTANT:  Lucita Lora. Shuford, P.A.-C.   ANESTHESIA:  General endotracheal as well as a preop interscalene block.   ESTIMATED BLOOD LOSS:  Minimal.   DRAINS:  None.   HISTORY:  Ms. Lamontagne is a 55 year old female who has had chronic right  shoulder pain with progressively increasing functional limitations.  Examinations show pain and weakness persisting in the rotator cuff.  She  has preoperative x-rays and MRI scan which confirm a full thickness and  moderately retracted rotator cuff tear with AC joint arthropathy.  Due  to her ongoing pain and functional limitations, she is brought to the  operating room at this time for planned right shoulder arthroscopy as  described below.   We appropriately counseled Ms. Saefong on treatment options as well as  risks versus  benefits thereof.  Possible surgical complications,  bleeding, infection, neurovascular injury, persistent pain, loss of  motion, anesthetic complications, recurrence of rotator cuff tear and  possible need for additional surgery were reviewed.  She understands and  accepts and agrees with our plan for the procedure.   PROCEDURE IN DETAIL:  After undergoing routine preoperative evaluation,  the patient received prophylaxis antibiotics and an interscalene block  was established in holding area with the anesthesia department.  Placed  supine on the operating table and underwent further induction of the  general endotracheal anesthesia.  Turned to left lateral decubitus  position on a beanbag and appropriately padded and protected.  Right  shoulder examination under anesthesia revealed full passive motion.  There were no obvious instability patterns. Right arm suspended at 70  degrees of abduction with 10 pounds of traction.  The right shoulder  girdle region was sterilely prepped and draped in standard fashion.  Posterior port was established in the glenohumeral joint and the  anterior port was established under direct visualization.  Glenohumeral  articular surfaces were in excellent condition.  Biceps tendons showed  normal caliber, good quality of tissue, and no evidence for instability  either proximally or distally.  There was a general tearing at the  anterior as well as superior labrum and these areas were debrided with a  shaver. The rotator cuff was inspected and showed an obvious full  thickness and moderately retracted tear involving the entire distal  supraspinatus and extending into the infraspinatus in the upper portion  of the subscapularis.  There were no obvious instability patterns.  There was no obvious additional intraarticular pathology.  At this  point, fluid and instruments were then removed.  We almost dropped down  at 30 degrees of abduction with the arthroscope  being introduced in the  subacromial space and the posterior port and intraarticular lateral port  was established in subacromial space.  Abundant proliferative bursal  tissue, multiple adhesions were identified and these were divided and  excised with a combination of the shaver and Arthrex wand.  The wand was  then used to remove the periosteum from the inner surface of the  anterior half of the acromion.  Then, subacromial decompression was  performed with a bur cutting to tap on morphology. A portal was then  established, anterior to the distal clavicle and the distal clavicle  resection performed with a bur.  Care was taken to make sure the entire  circumference of the  distal clavicle could be visualized to ensure  adequate removal of bone.  We then completed the subacromial/deltoid  bursectomy.  We used the shaver to debride the rotator cuff tear back to  healthy-appearing tissue and ultimately had a defect of approximately 4  cm in width.  The greater tuberosity was prepared by removing the soft  tissue and that bone was gently debrided.  We then established an  accessory portal devicer and then through a stab wound of the lateral  marked to the acromion, we placed 2 Arthrex PEEK Corkscrew Suture  Anchors.  The limbs of the suture anchor were then passed to the  adjacent margins of the rotator cuff, used the MITEK suture retriever in  a horizontal mattress construct, and these were then tied with sliding  locking knots followed by multiple overhead throws in alternating pose.  We again created a suture bridge with 2 Arthrex PEEK SwiveLock suture  anchors which nicely reinforced the repair and compressed the margin of  the rotator cuff against the bony bed on the tuberosity.  All suture  limbs were then clipped.  The repair was inspected and found to be to  our satisfaction.  Final hemostasis was obtained.  All fluid and  instruments were removed.  The ports were closed with  Monocryl and Steri-  Strips.  A bulky dry dressing was then taped over the right shoulder,  randomized, and placed in a sling immobilizer.  The patient was placed  supine, extubated, and taken to the recovery room in stable condition.      Vania Rea. Supple, M.D.  Electronically Signed     KMS/MEDQ  D:  04/27/2008  T:  04/28/2008  Job:  [id number]

## 2011-04-18 NOTE — Op Note (Signed)
NAME:  [name], [name]               ACCOUNT NO.:  [id number]   MEDICAL RECORD NO.:  [id number]          PATIENT TYPE:  AMB   LOCATION:  DSC                          FACILITY:  [facility]   PHYSICIAN:  Robert A. Thurston Hole, M.D. DATE OF BIRTH:  11/26/1956   DATE OF PROCEDURE:  11/10/2005  DATE OF DISCHARGE:                                 OPERATIVE REPORT   PREOPERATIVE DIAGNOSIS:  Right elbow lateral epicondylitis with partial  tear.   POSTOPERATIVE DIAGNOSIS:  Right elbow lateral epicondylitis with partial  tear.   PROCEDURE:  Right elbow lateral epicondylectomy with a partial lateral  epicondylar release, debridement and repair.   SURGEON:  Elana Alm. Thurston Hole, M.D.   ASSISTANT:  Julien Girt, P.A.   ANESTHESIA:  General.   OPERATIVE TIME:  45 minutes.   COMPLICATIONS:  None.   INDICATIONS FOR PROCEDURE:  [name] is a 49-year woman who has had  significant right elbow lateral epicondyle for pain with partial tear  documented by MRI exam, who has failed conservative care is now to undergo a  lateral epicondylar release, debridement and repair.   DESCRIPTION:  [name] was brought to operating room on November 10, 2005,  placed on the operative table in supine position.  After being placed under  general anesthesia, she received Ancef 1 g IV preoperatively for  prophylaxis.  Her right elbow was examined.  She had full range of motion of  her elbow with stable ligamentous exam.  The right arm was prepped using  sterile DuraPrep and draped using sterile technique.  The arm was  exsanguinated and tourniquet elevated to 275 mm.  Initially through a 3-4 cm  curvilinear anterolateral incision based over the lateral condyle, initial  exposure was made and underlying subcutaneous tissues were incised along  with the skin incision.  The fascia over the lateral epicondylar tendons was  incised longitudinally.  The lateral epicondylar tendons were then carefully  dissected off the  lateral epicondyle, protecting the radial nerve.  Found to  have significant partial tearing of the ECRB and ECRL tendon, and this was  completely released.  The radial head-capitellum joint was not entered.  After the release and debridement of the degenerative tendinosis tissue,  healthy-appearing lateral condylar tendinous tissue remained.  Multiple  small drill holes were then placed in the lateral epicondyle after partial  lateral epicondylectomy had been carried out.  Through these drill holes 2-0  FiberWire was placed in a mattress suture technique to repair the lateral  epicondylar tendons back down to the lateral epicondyle approximately 3-4 mm  distal to their original attachment site under less tension.  After this was  done, a firm and tight repair was achieved.  The wound was then irrigated.  The fascia over the repair was then closed with 2-0 Vicryl suture.  Subcutaneous tissues closed with 2-0 Vicryl, subcuticular layer closed with  3-0 Monocryl.  Steri-Strips were applied. The wound injected with 0.2 puffs  of Marcaine. Sterile dressings, a long-arm splint applied, tourniquet was  released and the patient awakened and taken to the recovery room in stable  condition.   FOLLOW-UP CARE:  [name] will be followed as an outpatient on Percocet for  pain.  See her back in the office in a week for wound check and follow-up.      Robert A. Thurston Hole, M.D.  Electronically Signed     RAW/MEDQ  D:  11/10/2005  T:  11/10/2005  Job:  [id number]

## 2011-04-18 NOTE — Assessment & Plan Note (Signed)
[facility]                               PULMONARY OFFICE NOTE   LATERRICA, LIBMAN                      MRN:          [id number]  DATE:07/16/2006                            DOB:          1956/07/02    PROBLEM LIST:  1. Asthma.  2. Allergic rhinitis.  3. History of urticaria.  4. Restless leg syndrome.   HISTORY:  We had first begun working with her complaint of restless legs in  August of 2005.  There had been no history of iron deficiency, leg vein  disease or diabetes.  She says that it has really been present for years,  with a clear-cut description of aching and crawling sensations that  interfere with sleep.  We had tried Requip, which caused nausea, and  Mirapex, which caused malaise.  Clonazepam had worked best, using 0.5 mg at  h.s.  Some nights it is not quite enough.  Usually she sleeps now fairly  well with clonazepam, and her primary time of complaint is in the evening  after dinner, when she sits in a TV chair with her legs elevated.  Warm  baths have helped.  She goes for walks in the evening, and we have discussed  stretching, support hose and other comfort measures.  We have discussed  additional medications including Sinemet, opiates, and options.   The other problems for which I have followed her, including minimal  intermittent asthma, seasonal allergic rhinitis, and acute urticaria, have  not been problems recently.  In the last week or so, she has become aware of  some increased postnasal drainage, as the fall season moves closer, but she  is not having much itching or sneezing, and she feels current meds are  sufficient.   MEDICATIONS:  1. Allegra 180 mg.  2. Advair 100/50 b.i.d.  3. Estrogen gel.  4. Prometrium.  5. Clonazepam 0.5 mg at h.s.  6. Albuterol HFA is available, but rarely used.   ALLERGIES:  No medication allergy.   OBJECTIVE:  VITAL SIGNS:  Weight 169 pounds, BP 106/70, pulse regular at 68,  room  air saturation 98%.  GENERAL:  Pleasant, comfortable-appearing woman.  No restlessness, tremor or  abnormal movement is evident.  SKIN:  No rash.  ADENOPATHY:  None found.  HEENT:  Conjunctivae look clear, nasal mucosa is wet but not edematous.  Pharynx is clear.  LUNGS:  Clear and breathing unlabored.  CARDIAC:  Heart sounds are regular without murmur.   IMPRESSION:  1. Dominant complaint today of restless legs.  2. Allergic rhinitis.  3. History of urticaria.  4. History of mild intermittent asthma.   PLAN:  1. Options discussed as above.  2. Clonazepam 0.5 mg refilled, 1 or 2 h.s. p.r.n.  3. Try Darvocet N 100 in the early evening if needed, to see if that works      better without excess sedation for suppressing leg discomfort in the      hours before sleep.  4. Schedule return 1 year, earlier p.r.n.  Clinton D. Maple Hudson, MD, FCCP, FACP   CDY/MedQ  DD:  07/16/2006  DT:  07/17/2006  Job #:  [id number]

## 2011-05-08 ENCOUNTER — Other Ambulatory Visit: Payer: Self-pay | Admitting: Internal Medicine

## 2011-05-09 ENCOUNTER — Telehealth: Payer: Self-pay | Admitting: Internal Medicine

## 2011-05-09 MED ORDER — FLUTICASONE-SALMETEROL 250-50 MCG/DOSE IN AEPB: 1.0000 | INHALATION_SPRAY | Freq: Two times a day (BID) | RESPIRATORY_TRACT | Status: DC

## 2011-05-09 NOTE — Telephone Encounter (Signed)
Spoke with pharmacist and gave verbal okay for pt to have 3 month supply of advair, and advised needs to keep pendind appt for refills.

## 2011-05-09 NOTE — Telephone Encounter (Signed)
Requesting rx: ADVAIR

## 2011-05-19 ENCOUNTER — Encounter: Payer: Self-pay | Admitting: Internal Medicine

## 2011-05-19 ENCOUNTER — Ambulatory Visit (INDEPENDENT_AMBULATORY_CARE_PROVIDER_SITE_OTHER): Payer: 59 | Admitting: Internal Medicine

## 2011-05-19 VITALS — BP 110/58 | HR 74 | Ht 69.0 in | Wt 168.8 lb

## 2011-05-19 DIAGNOSIS — J309 Allergic rhinitis, unspecified: Secondary | ICD-10-CM

## 2011-05-19 DIAGNOSIS — J45909 Unspecified asthma, uncomplicated: Secondary | ICD-10-CM

## 2011-05-19 DIAGNOSIS — K219 Gastro-esophageal reflux disease without esophagitis: Secondary | ICD-10-CM

## 2011-05-19 MED ORDER — FLUTICASONE-SALMETEROL 100-50 MCG/DOSE IN AEPB: 1.0000 | INHALATION_SPRAY | Freq: Two times a day (BID) | RESPIRATORY_TRACT | Status: DC

## 2011-05-19 NOTE — Assessment & Plan Note (Signed)
Mild intermittent asthma with nrmal PFT. We will try dropping Advair to 100/50. Reflux was a masking discomfort and and aggravating factior

## 2011-05-19 NOTE — Assessment & Plan Note (Signed)
Allegra is sufficient

## 2011-05-19 NOTE — Progress Notes (Signed)
  Subjective:    Patient ID: [name], female    DOB: 08-17-1956, 55 y.o.   MRN: [id number]  HPI 05/19/11- 6 yoF followed for asthma, allergic rhinitis, complicated by GERD and hx of urticaria Last here May 14, 2010- note reviewed. She saw Dr Annalee Genta last year who did laryngosopy verifying reflux and treating nexium . This has been big help and cough largely went away.  She had a really good spring. Feels she needs to continue regular allegra and Advair PFT- 07/01/10- FEV1 2.79/86%, FEV1/FVC 0.79 Mecholyl- 07/01/10- negative / normal   Review of Systems Constitutional:   No weight loss, night sweats,  Fevers, chills, fatigue, lassitude. HEENT:   No headaches,  Difficulty swallowing,  Tooth/dental problems,  Sore throat,                No sneezing, itching, ear ache, nasal congestion, post nasal drip,   CV:  No chest pain,  Orthopnea, PND, swelling in lower extremities, anasarca, dizziness, palpitations  GI  No heartburn, indigestion, abdominal pain, nausea, vomiting, diarrhea, change in bowel habits, loss of appetite  Resp: No shortness of breath with exertion or at rest.  No excess mucus, no productive cough,  No non-productive cough,  No coughing up of blood.  No change in color of mucus.  No wheezing.    Skin: no rash or lesions.  GU: no dysuria, change in color of urine, no urgency or frequency.  No flank pain.  MS:  No joint pain or swelling.  No decreased range of motion.  No back pain.  Psych:  No change in mood or affect. No depression or anxiety.  No memory loss.      Objective:   Physical Exam General- Alert, Oriented, Affect-appropriate, Distress- none acute  Skin- rash-none, lesions- none, excoriation- none  Lymphadenopathy- none  Head- atraumatic  Eyes- Gross vision intact, PERRLA, conjunctivae clear secretions  Ears- Hearing, canals, Tm- normal  Nose- Clear, No- Septal dev, mucus, polyps, erosion, perforation   Throat- Mallampati II , mucosa clear ,  drainage- none, tonsils- atrophic  Neck- flexible , trachea midline, no stridor , thyroid nl, carotid no bruit  Chest - symmetrical excursion , unlabored     Heart/CV- RRR , no murmur , no gallop  , no rub, nl s1 s2                     - JVD- none , edema- none, stasis changes- none, varices- none     Lung- clear to P&A, wheeze- none, cough- none , dullness-none, rub- none     Chest wall-   Abd- tender-no, distended-no, bowel sounds-present, HSM- no  Br/ Gen/ Rectal- Not done, not indicated  Extrem- cyanosis- none, clubbing, none, atrophy- none, strength- nl  Neuro- grossly intact to observation         Assessment & Plan:

## 2011-05-19 NOTE — Patient Instructions (Signed)
Try reducing Advair to 100/50. Call if this isn't working.

## 2011-05-19 NOTE — Assessment & Plan Note (Signed)
Controlled with maintenance Nexium

## 2011-06-12 ENCOUNTER — Encounter (HOSPITAL_COMMUNITY): Payer: 59 | Admitting: Physician Assistant

## 2011-06-18 ENCOUNTER — Encounter (HOSPITAL_COMMUNITY): Payer: 59 | Admitting: Psychiatry

## 2011-07-08 ENCOUNTER — Encounter (HOSPITAL_COMMUNITY): Payer: 59 | Admitting: Physician Assistant

## 2011-07-08 DIAGNOSIS — F329 Major depressive disorder, single episode, unspecified: Secondary | ICD-10-CM

## 2011-08-27 LAB — URINALYSIS, ROUTINE W REFLEX MICROSCOPIC
Bilirubin Urine: NEGATIVE
Glucose, UA: NEGATIVE
Hgb urine dipstick: NEGATIVE
Ketones, ur: NEGATIVE
Protein, ur: NEGATIVE
Urobilinogen, UA: 0.2

## 2011-08-27 LAB — COMPREHENSIVE METABOLIC PANEL
ALT: 16
Alkaline Phosphatase: 58
BUN: 9
CO2: 29
Chloride: 102
GFR calc non Af Amer: >60
Glucose, Bld: 80
Potassium: 3.8
Sodium: 138
Total Bilirubin: 0.6
Total Protein: 6.3

## 2011-08-27 LAB — CBC
HCT: 37.2
Hemoglobin: 12.5
RBC: 4.25
RDW: 13.2

## 2011-08-27 LAB — PROTIME-INR
INR: 1
Prothrombin Time: 13

## 2011-09-20 ENCOUNTER — Inpatient Hospital Stay (INDEPENDENT_AMBULATORY_CARE_PROVIDER_SITE_OTHER)
Admission: RE | Admit: 2011-09-20 | Discharge: 2011-09-20 | Disposition: A | Discharge status: Home / Self Care | Payer: 59 | Source: Ambulatory Visit | Attending: Emergency Medicine | Admitting: Emergency Medicine

## 2011-09-20 DIAGNOSIS — N390 Urinary tract infection, site not specified: Secondary | ICD-10-CM

## 2011-09-20 LAB — POCT URINALYSIS DIP (DEVICE)
Glucose, UA: NEGATIVE mg/dL
Nitrite: NEGATIVE
Specific Gravity, Urine: >=1.03 (ref 1.005–1.030)
Urobilinogen, UA: 0.2 mg/dL (ref 0.0–1.0)
pH: 6 (ref 5.0–8.0)

## 2011-09-22 LAB — URINE CULTURE
Culture  Setup Time: 201210210338
Culture: NO GROWTH

## 2011-10-09 ENCOUNTER — Other Ambulatory Visit: Payer: Self-pay | Admitting: Physician Assistant

## 2011-10-09 ENCOUNTER — Encounter (HOSPITAL_COMMUNITY): Payer: 59 | Admitting: Physician Assistant

## 2011-10-09 ENCOUNTER — Encounter: Payer: Self-pay | Admitting: Physician Assistant

## 2011-10-09 NOTE — H&P (Signed)
[name] is an 55 y.o. female.   Chief Complaint: right great toe pain HPI: [name] is seen for follow-up from her right great toe hallux rigidus. We injected her toe in March which helped temporarily but pain has recurred. She's taking ibuprofen. She has pain with weightbearing pushing off and this is getting more difficult. She's had a number of injections in the toe.  Past Medical History  Diagnosis Date  . Allergic rhinitis   . Urticaria 1999  . Angioedema 1999  . Asthma     Past Surgical History  Procedure Date  . Cesarean section   . Shoulder arthroscopy w/ rotator cuff repair 2009  . Lateral epicondyle release 2007    right  . Shoulder arthroscopy w/ subacromial decompression and distal clavicle excision 2011    left  . Back surgery     Family History  Problem Relation Age of Onset  . COPD Father   . Heart disease Mother   . Arthritis Father   . Emphysema Father   . COPD      sibling   . Arthritis      sibling   Social History:  reports that she has never smoked. She does not have any smokeless tobacco history on file. She reports that she drinks about 1.2 ounces of alcohol per week. Her drug history not on file.  Allergies: No Known Allergies  Medications Prior to Admission  Medication Sig Dispense Refill  . Estradiol (ESTROGEL) 0.75 MG/1.25 GM (0.06%) topical gel Place 1.25 g onto the skin daily.        . fexofenadine (ALLEGRA) 180 MG tablet Take 180 mg by mouth daily.        Marland Kitchen FLUoxetine (PROZAC) 10 MG capsule Take 40 mg by mouth daily.       . Fluticasone-Salmeterol (ADVAIR DISKUS) 100-50 MCG/DOSE AEPB Inhale 1 puff into the lungs 2 (two) times daily. Rinse mouth  3 each  3  . levalbuterol (XOPENEX HFA) 45 MCG/ACT inhaler Inhale 2 puffs into the lungs 4 (four) times daily as needed.        . progesterone (PROMETRIUM) 100 MG capsule Take 100 mg by mouth daily.        . rizatriptan (MAXALT) 10 MG tablet Take 1 by mouth 1-2 times a week migraine onset         . topiramate (TOPAMAX) 100 MG tablet Take 300 mg by mouth at bedtime.         No current facility-administered medications on file as of 10/09/2011.    No results found for this or any previous visit (from the past 48 hour(s)). No results found.  Review of Systems  Constitutional: Negative.   HENT: Negative.   Eyes: Negative.   Respiratory: Negative.   Cardiovascular: Negative.   Gastrointestinal: Negative.   Genitourinary: Positive for dysuria, urgency and frequency.       10/09/2011 on Cipro for UTI  Last dose scheduled for 10/16/2011  Musculoskeletal: Positive for joint pain.  Skin: Negative.   Neurological: Negative.   Endo/Heme/Allergies: Negative.   Psychiatric/Behavioral: Negative.     There were no vitals taken for this visit. Physical Exam  Constitutional: She is oriented to person, place, and time. She appears well-developed and well-nourished.  HENT:  Head: Normocephalic and atraumatic.  Eyes: EOM are normal. Pupils are equal, round, and reactive to light.  Neck: Neck supple.  Cardiovascular: Normal rate and regular rhythm.   Respiratory: Effort normal.  GI: Soft.  Genitourinary:  Not pertinent to current symptomatology therefore not examined.  Musculoskeletal:       Examination of her right great toe reveals dorsiflexion to 5 degrees plantarflexion 15 degrees with pain, overall alignment is satisfactory mild swelling is noted, crepitation noted as well. Exam of the other toes show no pain or deformity. Vascular exam: pulses 2+ and symmetric.  Neurological: She is alert and oriented to person, place, and time.  Skin: Skin is warm and dry.  Psychiatric: She has a normal mood and affect. Her behavior is normal. Judgment and thought content normal.     Assessment/Plan Patient Active Problem List  Diagnoses  . RESTLESS LEG SYNDROME  . ALLERGIC RHINITIS  . ASTHMA, INTERMITTENT, MILD  . GERD  . URTICARIA, ALLERGIC  . COUGH   Plan: The risks, benefits,  and possible complications of the procedure were discussed in detail with the patient.  The patient is without question.  Proceed with surgery.   [name] J 10/09/2011, 4:47 PM

## 2011-10-15 ENCOUNTER — Other Ambulatory Visit (HOSPITAL_COMMUNITY): Payer: Self-pay | Admitting: *Deleted

## 2011-10-15 DIAGNOSIS — F39 Unspecified mood [affective] disorder: Secondary | ICD-10-CM

## 2011-10-15 MED ORDER — ARIPIPRAZOLE 2 MG PO TABS: 2.0000 mg | ORAL_TABLET | Freq: Every day | ORAL | Status: DC

## 2011-10-17 ENCOUNTER — Other Ambulatory Visit (HOSPITAL_COMMUNITY): Payer: Self-pay | Admitting: Physician Assistant

## 2011-10-20 ENCOUNTER — Encounter (HOSPITAL_COMMUNITY): Payer: Self-pay | Admitting: Physician Assistant

## 2011-10-20 ENCOUNTER — Ambulatory Visit (HOSPITAL_COMMUNITY): Payer: 59 | Admitting: Physician Assistant

## 2011-10-20 VITALS — BP 109/68 | HR 76 | Ht 68.0 in | Wt 172.2 lb

## 2011-10-20 DIAGNOSIS — E559 Vitamin D deficiency, unspecified: Secondary | ICD-10-CM

## 2011-10-20 DIAGNOSIS — D509 Iron deficiency anemia, unspecified: Secondary | ICD-10-CM

## 2011-10-20 DIAGNOSIS — F39 Unspecified mood [affective] disorder: Secondary | ICD-10-CM

## 2011-10-20 MED ORDER — FLUOXETINE HCL 40 MG PO CAPS: 40.0000 mg | ORAL_CAPSULE | Freq: Every day | ORAL | Status: DC

## 2011-10-20 MED ORDER — FLUOXETINE HCL 10 MG PO CAPS: 40.0000 mg | ORAL_CAPSULE | Freq: Every day | ORAL | Status: DC

## 2011-10-20 MED ORDER — BUSPIRONE HCL 15 MG PO TABS: 15.0000 mg | ORAL_TABLET | Freq: Two times a day (BID) | ORAL | Status: DC

## 2011-10-20 MED ORDER — ARIPIPRAZOLE 2 MG PO TABS: 2.0000 mg | ORAL_TABLET | Freq: Every day | ORAL | Status: DC

## 2011-10-20 NOTE — Progress Notes (Signed)
  [facility] [address] Progress Note  [name] [id number] 55 y.o.  10/20/2011 9:31 AM  Chief Complaint: Med refill  History of Present Illness: Depression Suicidal Ideation: No Plan Formed: No Patient has means to carry out plan: No  Homicidal Ideation: No Plan Formed: No Patient has means to carry out plan: No  Review of Systems: Psychiatric: Agitation: No Hallucination: No Depressed Mood: Yes Insomnia: Yes Hypersomnia: No Altered Concentration: Yes Feels Worthless: No Grandiose Ideas: No Belief In Special Powers: No New/Increased Substance Abuse: No Compulsions: No  Neurologic: Headache: No Seizure: No Paresthesias: No  Past Medical Family, Social History:   Outpatient Encounter Prescriptions as of 10/20/2011  Medication Sig Dispense Refill  . ARIPiprazole (ABILIFY) 2 MG tablet Take 1 tablet (2 mg total) by mouth daily.  90 tablet  0  . Estradiol (ESTROGEL) 0.75 MG/1.25 GM (0.06%) topical gel Place 1.25 g onto the skin daily.        . fexofenadine (ALLEGRA) 180 MG tablet Take 180 mg by mouth daily.        Marland Kitchen FLUoxetine (PROZAC) 10 MG capsule Take 40 mg by mouth daily.       . Fluticasone-Salmeterol (ADVAIR DISKUS) 100-50 MCG/DOSE AEPB Inhale 1 puff into the lungs 2 (two) times daily. Rinse mouth  3 each  3  . levalbuterol (XOPENEX HFA) 45 MCG/ACT inhaler Inhale 2 puffs into the lungs 4 (four) times daily as needed.        . progesterone (PROMETRIUM) 100 MG capsule Take 100 mg by mouth daily.        . rizatriptan (MAXALT) 10 MG tablet Take 1 by mouth 1-2 times a week migraine onset       . topiramate (TOPAMAX) 100 MG tablet Take 300 mg by mouth at bedtime.          Past Psychiatric History/Hospitalization(s): Anxiety: Yes Bipolar Disorder: No Depression: Yes Mania: No Psychosis: No Schizophrenia: No Personality Disorder: No Hospitalization for psychiatric illness: No History of Electroconvulsive Shock Therapy: No Prior Suicide Attempts:  No  Physical Exam: Constitutional:  BP 109/68  Pulse 76  Ht 5\' 8"  (1.727 m)  Wt 172 lb 3.2 oz (78.109 kg)  BMI 26.18 kg/m2  General Appearance: alert, oriented, no acute distress  Musculoskeletal: Strength & Muscle Tone: within normal limits Gait & Station: normal Patient leans: N/A  Psychiatric: Speech (describe rate, volume, coherence, spontaneity, and abnormalities if any): Normal  Thought Process (describe rate, content, abstract reasoning, and computation): Normal  Associations: Coherent and Intact  Thoughts: normal  Mental Status: Orientation: oriented to person Mood & Affect: normal affect Attention Span & Concentration: Normal but distracted due to issues with her daughter.  Medical Decision Making (Choose Three): Established Problem, Stable/Improving (1) and Established Problem, Worsening (2)  Assessment: Axis I: Depression related to family issues  Axis II: NA  Axis III: Multiple health issues currently stabe  Axis IV: Family stressors due to 37yr. daughter  Axis V: GAF: 65   Plan: Prescriptions are done as written.           She will follow up in 3-6 months.  Alizaya Oshea, PA 10/20/2011

## 2011-10-20 NOTE — Patient Instructions (Signed)
Discussed labs with patient.  She will get them and follow up as planned. She has declined out patient therapy at this time. Also did discuss having a frank goal setting meeting with her daughter.

## 2011-10-20 NOTE — Progress Notes (Signed)
Addended by: Verne Spurr T on: 10/20/2011 12:00 PM   Modules accepted: Orders

## 2011-10-21 ENCOUNTER — Encounter (HOSPITAL_BASED_OUTPATIENT_CLINIC_OR_DEPARTMENT_OTHER): Payer: Self-pay | Admitting: *Deleted

## 2011-10-21 ENCOUNTER — Other Ambulatory Visit (HOSPITAL_COMMUNITY): Payer: Self-pay | Admitting: Obstetrics and Gynecology

## 2011-10-21 DIAGNOSIS — Z1231 Encounter for screening mammogram for malignant neoplasm of breast: Secondary | ICD-10-CM

## 2011-10-21 NOTE — Progress Notes (Signed)
Had ekg in system 10/12

## 2011-10-28 ENCOUNTER — Encounter (HOSPITAL_COMMUNITY): Payer: Self-pay | Admitting: Physician Assistant

## 2011-10-28 ENCOUNTER — Encounter (HOSPITAL_BASED_OUTPATIENT_CLINIC_OR_DEPARTMENT_OTHER): Payer: Self-pay | Admitting: Certified Registered"

## 2011-10-28 ENCOUNTER — Ambulatory Visit (HOSPITAL_BASED_OUTPATIENT_CLINIC_OR_DEPARTMENT_OTHER): Payer: 59 | Admitting: Certified Registered"

## 2011-10-28 ENCOUNTER — Encounter (HOSPITAL_BASED_OUTPATIENT_CLINIC_OR_DEPARTMENT_OTHER)
Admission: RE | Disposition: A | Discharge status: Home / Self Care | Payer: Self-pay | Source: Ambulatory Visit | Attending: Orthopedic Surgery

## 2011-10-28 ENCOUNTER — Ambulatory Visit (HOSPITAL_BASED_OUTPATIENT_CLINIC_OR_DEPARTMENT_OTHER)
Admission: RE | Admit: 2011-10-28 | Discharge: 2011-10-28 | Disposition: A | Discharge status: Home / Self Care | Payer: 59 | Source: Ambulatory Visit | Attending: Orthopedic Surgery | Admitting: Orthopedic Surgery

## 2011-10-28 ENCOUNTER — Encounter (HOSPITAL_BASED_OUTPATIENT_CLINIC_OR_DEPARTMENT_OTHER): Payer: Self-pay | Admitting: *Deleted

## 2011-10-28 DIAGNOSIS — K219 Gastro-esophageal reflux disease without esophagitis: Secondary | ICD-10-CM | POA: Insufficient documentation

## 2011-10-28 DIAGNOSIS — J45909 Unspecified asthma, uncomplicated: Secondary | ICD-10-CM | POA: Insufficient documentation

## 2011-10-28 DIAGNOSIS — M2020 Hallux rigidus, unspecified foot: Secondary | ICD-10-CM | POA: Insufficient documentation

## 2011-10-28 HISTORY — PX: CHEILECTOMY: SHX1336

## 2011-10-28 LAB — CBC WITH DIFFERENTIAL/PLATELET
Basophils Absolute: 0 10*3/uL (ref 0.0–0.1)
Basophils Relative: 0 % (ref 0–1)
Eosinophils Absolute: 0.2 10*3/uL (ref 0.0–0.7)
HCT: 35.9 % — ABNORMAL LOW (ref 36.0–46.0)
MCH: 28.7 pg (ref 26.0–34.0)
MCHC: 32 g/dL (ref 30.0–36.0)
Monocytes Absolute: 0.6 10*3/uL (ref 0.1–1.0)
Neutro Abs: 5.4 10*3/uL (ref 1.7–7.7)
RDW: 13.9 % (ref 11.5–15.5)

## 2011-10-28 LAB — TSH: TSH: 1.498 u[IU]/mL (ref 0.350–4.500)

## 2011-10-28 LAB — VITAMIN D 25 HYDROXY (VIT D DEFICIENCY, FRACTURES): Vit D, 25-Hydroxy: 50 ng/mL (ref 30–89)

## 2011-10-28 SURGERY — CHEILECTOMY
Anesthesia: Regional | Site: Ankle | Laterality: Right | Wound class: Clean

## 2011-10-28 MED ORDER — ONDANSETRON HCL 4 MG/2ML IJ SOLN
INTRAMUSCULAR | Status: DC | PRN
  Administered 2011-10-28: 4 mg via INTRAVENOUS

## 2011-10-28 MED ORDER — LIDOCAINE HCL (PF) 1 % IJ SOLN
INTRAMUSCULAR | Status: DC | PRN
  Administered 2011-10-28: 2 mL

## 2011-10-28 MED ORDER — LACTATED RINGERS IV SOLN: 500.0000 mL | INTRAVENOUS | Status: DC

## 2011-10-28 MED ORDER — MIDAZOLAM HCL 2 MG/2ML IJ SOLN
0.5000 mg | INTRAMUSCULAR | Status: DC | PRN
  Administered 2011-10-28: 2 mg via INTRAVENOUS

## 2011-10-28 MED ORDER — EPHEDRINE SULFATE 50 MG/ML IJ SOLN
INTRAMUSCULAR | Status: DC | PRN
  Administered 2011-10-28: 10 mg via INTRAVENOUS

## 2011-10-28 MED ORDER — MIDAZOLAM HCL 5 MG/5ML IJ SOLN
INTRAMUSCULAR | Status: DC | PRN
  Administered 2011-10-28: 1 mg via INTRAVENOUS

## 2011-10-28 MED ORDER — LIDOCAINE HCL 1.5 % IJ SOLN
INTRAMUSCULAR | Status: DC | PRN
  Administered 2011-10-28: 25 mL via INTRADERMAL

## 2011-10-28 MED ORDER — METOCLOPRAMIDE HCL 5 MG/ML IJ SOLN: 10.0000 mg | Freq: Once | INTRAMUSCULAR | Status: DC | PRN

## 2011-10-28 MED ORDER — KETOROLAC TROMETHAMINE 30 MG/ML IJ SOLN: 15.0000 mg | Freq: Once | INTRAMUSCULAR | Status: DC | PRN

## 2011-10-28 MED ORDER — LACTATED RINGERS IV SOLN
INTRAVENOUS | Status: DC
  Administered 2011-10-28: 12:00:00 via INTRAVENOUS

## 2011-10-28 MED ORDER — LIDOCAINE-PRILOCAINE 2.5-2.5 % EX CREA: 1.0000 "application " | TOPICAL_CREAM | Freq: Once | CUTANEOUS | Status: DC

## 2011-10-28 MED ORDER — ROPIVACAINE HCL 5 MG/ML IJ SOLN
INTRAMUSCULAR | Status: DC | PRN
  Administered 2011-10-28: 25 mL via EPIDURAL

## 2011-10-28 MED ORDER — MIDAZOLAM HCL 2 MG/2ML IJ SOLN: 1.0000 mg | INTRAMUSCULAR | Status: DC | PRN

## 2011-10-28 MED ORDER — IBUPROFEN 200 MG PO TABS: 200.0000 mg | ORAL_TABLET | Freq: Four times a day (QID) | ORAL | Status: DC | PRN

## 2011-10-28 MED ORDER — MORPHINE SULFATE 2 MG/ML IJ SOLN: 0.0500 mg/kg | INTRAMUSCULAR | Status: DC | PRN

## 2011-10-28 MED ORDER — CEFAZOLIN SODIUM 1-5 GM-% IV SOLN
1.0000 g | INTRAVENOUS | Status: AC
  Administered 2011-10-28: 1 g via INTRAVENOUS

## 2011-10-28 MED ORDER — OXYMETAZOLINE HCL 0.05 % NA SOLN: 2.0000 | Freq: Once | NASAL | Status: DC

## 2011-10-28 MED ORDER — PROPOFOL 10 MG/ML IV EMUL
INTRAVENOUS | Status: DC | PRN
  Administered 2011-10-28: 200 mg via INTRAVENOUS

## 2011-10-28 MED ORDER — DEXAMETHASONE SODIUM PHOSPHATE 4 MG/ML IJ SOLN
INTRAMUSCULAR | Status: DC | PRN
  Administered 2011-10-28: 10 mg via INTRAVENOUS

## 2011-10-28 MED ORDER — FENTANYL CITRATE 0.05 MG/ML IJ SOLN
50.0000 ug | INTRAMUSCULAR | Status: DC | PRN
  Administered 2011-10-28: 100 ug via INTRAVENOUS

## 2011-10-28 MED ORDER — FENTANYL CITRATE 0.05 MG/ML IJ SOLN: 25.0000 ug | INTRAMUSCULAR | Status: DC | PRN

## 2011-10-28 MED ORDER — LACTATED RINGERS IV SOLN: INTRAVENOUS | Status: DC

## 2011-10-28 SURGICAL SUPPLY — 63 items
APL SKNCLS STERI-STRIP NONHPOA (GAUZE/BANDAGES/DRESSINGS) ×1
BANDAGE ACE 4 STERILE (GAUZE/BANDAGES/DRESSINGS) ×1 IMPLANT
BANDAGE ELASTIC 4 VELCRO ST LF (GAUZE/BANDAGES/DRESSINGS) ×1 IMPLANT
BENZOIN TINCTURE PRP APPL 2/3 (GAUZE/BANDAGES/DRESSINGS) ×1 IMPLANT
BLADE AVERAGE 25X9 (BLADE) ×1 IMPLANT
BLADE OSCIL/SAGITTAL W/10 ST (BLADE) IMPLANT
BLADE SURG 15 STRL LF DISP TIS (BLADE) ×1 IMPLANT
BLADE SURG 15 STRL SS (BLADE) ×2
BNDG CMPR 9X4 STRL LF SNTH (GAUZE/BANDAGES/DRESSINGS) ×1
BNDG COHESIVE 1X5 TAN STRL LF (GAUZE/BANDAGES/DRESSINGS) IMPLANT
BNDG COHESIVE 4X5 TAN STRL (GAUZE/BANDAGES/DRESSINGS) ×2 IMPLANT
BNDG ESMARK 4X9 LF (GAUZE/BANDAGES/DRESSINGS) ×2 IMPLANT
CAST PADDING STERILE 4X4 (CAST SUPPLIES) ×1 IMPLANT
COVER MAYO STAND STRL (DRAPES) ×2 IMPLANT
COVER TABLE BACK 60X90 (DRAPES) ×2 IMPLANT
CUFF TOURNIQUET SINGLE 18IN (TOURNIQUET CUFF) IMPLANT
CUFF TOURNIQUET SINGLE 34IN LL (TOURNIQUET CUFF) IMPLANT
DRAPE EXTREMITY T 121X128X90 (DRAPE) ×2 IMPLANT
DRAPE OEC MINIVIEW 54X84 (DRAPES) ×1 IMPLANT
DRAPE U-SHAPE 47X51 STRL (DRAPES) ×2 IMPLANT
DRSG PAD ABDOMINAL 8X10 ST (GAUZE/BANDAGES/DRESSINGS) IMPLANT
DURAPREP 26ML APPLICATOR (WOUND CARE) ×2 IMPLANT
ELECT REM PT RETURN 9FT ADLT (ELECTROSURGICAL) ×2
ELECTRODE REM PT RTRN 9FT ADLT (ELECTROSURGICAL) IMPLANT
GAUZE XEROFORM 1X8 LF (GAUZE/BANDAGES/DRESSINGS) IMPLANT
GLOVE BIO SURGEON STRL SZ 6.5 (GLOVE) ×1 IMPLANT
GLOVE BIO SURGEON STRL SZ7 (GLOVE) ×1 IMPLANT
GLOVE BIOGEL PI IND STRL 7.0 (GLOVE) ×1 IMPLANT
GLOVE BIOGEL PI IND STRL 7.5 (GLOVE) ×1 IMPLANT
GLOVE BIOGEL PI INDICATOR 7.0 (GLOVE) ×1
GLOVE BIOGEL PI INDICATOR 7.5 (GLOVE) ×1
GLOVE SS BIOGEL STRL SZ 7.5 (GLOVE) ×1 IMPLANT
GLOVE SUPERSENSE BIOGEL SZ 7.5 (GLOVE)
GOWN PREVENTION PLUS XLARGE (GOWN DISPOSABLE) ×3 IMPLANT
NDL ADDISON D1/2 CIR (NEEDLE) IMPLANT
NDL HYPO 25X1 1.5 SAFETY (NEEDLE) IMPLANT
NDL SAFETY ECLIPSE 18X1.5 (NEEDLE) IMPLANT
NEEDLE ADDISON D1/2 CIR (NEEDLE) IMPLANT
NEEDLE HYPO 18GX1.5 SHARP (NEEDLE)
NEEDLE HYPO 22GX1.5 SAFETY (NEEDLE) IMPLANT
NEEDLE HYPO 25X1 1.5 SAFETY (NEEDLE) IMPLANT
PACK BASIN DAY SURGERY FS (CUSTOM PROCEDURE TRAY) ×2 IMPLANT
PAD CAST 3X4 CTTN HI CHSV (CAST SUPPLIES) IMPLANT
PADDING CAST ABS 4INX4YD NS (CAST SUPPLIES) ×1
PADDING CAST ABS COTTON 4X4 ST (CAST SUPPLIES) ×1 IMPLANT
PADDING CAST COTTON 3X4 STRL (CAST SUPPLIES)
PENCIL BUTTON HOLSTER BLD 10FT (ELECTRODE) ×1 IMPLANT
SPONGE GAUZE 4X4 12PLY (GAUZE/BANDAGES/DRESSINGS) ×2 IMPLANT
STRIP CLOSURE SKIN 1/2X4 (GAUZE/BANDAGES/DRESSINGS) ×1 IMPLANT
SUT ETHIBOND 0 MO6 C/R (SUTURE) IMPLANT
SUT ETHIBOND 3-0 V-5 (SUTURE) IMPLANT
SUT ETHILON 4 0 PS 2 18 (SUTURE) IMPLANT
SUT ETHILON 5 0 PS 2 18 (SUTURE) IMPLANT
SUT PROLENE 3 0 PS 2 (SUTURE) ×1 IMPLANT
SUT PROLENE 4 0 P 3 18 (SUTURE) ×1 IMPLANT
SUT VIC AB 0 CT1 27 (SUTURE)
SUT VIC AB 0 CT1 27XBRD ANBCTR (SUTURE) IMPLANT
SUT VIC AB 0 CT3 27 (SUTURE) IMPLANT
SUT VIC AB 2-0 SH 27 (SUTURE)
SUT VIC AB 2-0 SH 27XBRD (SUTURE) IMPLANT
SUT VIC AB 3-0 FS2 27 (SUTURE) ×1 IMPLANT
SYR BULB 3OZ (MISCELLANEOUS) ×2 IMPLANT
SYR CONTROL 10ML LL (SYRINGE) IMPLANT

## 2011-10-28 NOTE — Anesthesia Procedure Notes (Addendum)
Anesthesia Regional Block:  Popliteal block  Pre-Anesthetic Checklist: ,, timeout performed, Correct Patient, Correct Site, Correct Laterality, Correct Procedure, Correct Position, site marked, Risks and benefits discussed,  Surgical consent,  Pre-op evaluation,  At surgeon's request and post-op pain management  Laterality: Right  Prep: chloraprep       Needles:   Needle Type: Other   (Arrow Echogenic)   Needle Length: 9cm  Needle Gauge: 21    Additional Needles:  Procedures: ultrasound guided Popliteal block Narrative:  Start time: 10/28/2011 12:10 PM End time: 10/28/2011 12:23 PM Injection made incrementally with aspirations every 5 mL.  Performed by: Personally   Additional Notes: Ultrasound guidance used to: id relevant anatomy, confirm needle position, local anesthetic spread, avoidance of vascular puncture. Picture saved. No complications.    Popliteal block Performed by: Radford Pax    Procedure Name: LMA Insertion Date/Time: 10/28/2011 2:32 PM Performed by: Radford Pax Pre-anesthesia Checklist: Patient identified, Timeout performed, Emergency Drugs available, Suction available and Patient being monitored Patient Re-evaluated:Patient Re-evaluated prior to inductionOxygen Delivery Method: Circle System Utilized Preoxygenation: Pre-oxygenation with 100% oxygen Intubation Type: IV induction Ventilation: Mask ventilation without difficulty LMA: LMA inserted LMA Size: 4.0 Number of attempts: 1 (good fit and airway) Tube secured with: Tape (plastic tape used and bite gard placed on rt.) Dental Injury: Teeth and Oropharynx as per pre-operative assessment

## 2011-10-28 NOTE — Anesthesia Preprocedure Evaluation (Signed)
Anesthesia Evaluation  Patient identified by MRN, date of birth, ID band Patient awake    Reviewed: Allergy & Precautions, H&P , NPO status , Patient's Chart, lab work & pertinent test results, reviewed documented beta blocker date and time   Airway Mallampati: II TM Distance: >3 FB Neck ROM: full    Dental   Pulmonary asthma ,          Cardiovascular neg cardio ROS     Neuro/Psych Negative Neurological ROS  Negative Psych ROS   GI/Hepatic Neg liver ROS, GERD-  Medicated,  Endo/Other  Negative Endocrine ROS  Renal/GU negative Renal ROS  Genitourinary negative   Musculoskeletal   Abdominal   Peds  Hematology negative hematology ROS (+)   Anesthesia Other Findings See surgeon's H&P   Reproductive/Obstetrics negative OB ROS                           Anesthesia Physical Anesthesia Plan  ASA: II  Anesthesia Plan: General   Post-op Pain Management: MAC Combined w/ Regional for Post-op pain   Induction: Intravenous  Airway Management Planned: LMA  Additional Equipment:   Intra-op Plan:   Post-operative Plan: Extubation in OR  Informed Consent: I have reviewed the patients History and Physical, chart, labs and discussed the procedure including the risks, benefits and alternatives for the proposed anesthesia with the patient or authorized representative who has indicated his/her understanding and acceptance.     Plan Discussed with: CRNA and Surgeon  Anesthesia Plan Comments:         Anesthesia Quick Evaluation

## 2011-10-28 NOTE — Progress Notes (Signed)
Patient's pharmacy contacted and prescriptions to be filled as per pharmacist Morrie Sheldon- Norco 1-2 PO every 4-6 hours as needed for pain (35) and Robaxin 500 mg 1 PO every 8 hours as needed for spasms. Prescriptions wasted-witnessed by Amil Amen.

## 2011-10-28 NOTE — Brief Op Note (Signed)
10/28/2011  3:19 PM  PATIENT:  [name]  55 y.o. female  PRE-OPERATIVE DIAGNOSIS:  right great toe hallux rigidus  POST-OPERATIVE DIAGNOSIS:  right great toe hallux rigidus  PROCEDURE:  Procedure(s): RIGHT GREAT TOE DORSAL CHEILECTOMY  SURGEON:  Surgeon(s): Nilda Simmer, MD  PHYSICIAN ASSISTANT:   ASSISTANTS: NONE  ANESTHESIA:   general  EBL:  Total I/O In: 700 [I.V.:700] Out: -   BLOOD ADMINISTERED:none  DRAINS: none   LOCAL MEDICATIONS USED:  NONE  SPECIMEN:  No Specimen  DISPOSITION OF SPECIMEN:  N/A  COUNTS:  YES  TOURNIQUET:   Total Tourniquet Time Documented: Calf (Right) - 27 minutes  DICTATION: .Note written in EPIC  PLAN OF CARE: Discharge to home after PACU  PATIENT DISPOSITION:  PACU - hemodynamically stable.   Delay start of Pharmacological VTE agent (>24hrs) due to surgical blood loss or risk of bleeding: NA

## 2011-10-28 NOTE — H&P (View-Only) (Signed)
[name] is an 55 y.o. female.   Chief Complaint: right great toe pain HPI: [name] is seen for follow-up from her right great toe hallux rigidus. We injected her toe in March which helped temporarily but pain has recurred. She's taking ibuprofen. She has pain with weightbearing pushing off and this is getting more difficult. She's had a number of injections in the toe.  Past Medical History  Diagnosis Date  . Allergic rhinitis   . Urticaria 1999  . Angioedema 1999  . Asthma     Past Surgical History  Procedure Date  . Cesarean section   . Shoulder arthroscopy w/ rotator cuff repair 2009  . Lateral epicondyle release 2007    right  . Shoulder arthroscopy w/ subacromial decompression and distal clavicle excision 2011    left  . Back surgery     Family History  Problem Relation Age of Onset  . COPD Father   . Heart disease Mother   . Arthritis Father   . Emphysema Father   . COPD      sibling   . Arthritis      sibling   Social History:  reports that she has never smoked. She does not have any smokeless tobacco history on file. She reports that she drinks about 1.2 ounces of alcohol per week. Her drug history not on file.  Allergies: No Known Allergies  Medications Prior to Admission  Medication Sig Dispense Refill  . Estradiol (ESTROGEL) 0.75 MG/1.25 GM (0.06%) topical gel Place 1.25 g onto the skin daily.        . fexofenadine (ALLEGRA) 180 MG tablet Take 180 mg by mouth daily.        . FLUoxetine (PROZAC) 10 MG capsule Take 40 mg by mouth daily.       . Fluticasone-Salmeterol (ADVAIR DISKUS) 100-50 MCG/DOSE AEPB Inhale 1 puff into the lungs 2 (two) times daily. Rinse mouth  3 each  3  . levalbuterol (XOPENEX HFA) 45 MCG/ACT inhaler Inhale 2 puffs into the lungs 4 (four) times daily as needed.        . progesterone (PROMETRIUM) 100 MG capsule Take 100 mg by mouth daily.        . rizatriptan (MAXALT) 10 MG tablet Take 1 by mouth 1-2 times a week migraine onset         . topiramate (TOPAMAX) 100 MG tablet Take 300 mg by mouth at bedtime.         No current facility-administered medications on file as of 10/09/2011.    No results found for this or any previous visit (from the past 48 hour(s)). No results found.  Review of Systems  Constitutional: Negative.   HENT: Negative.   Eyes: Negative.   Respiratory: Negative.   Cardiovascular: Negative.   Gastrointestinal: Negative.   Genitourinary: Positive for dysuria, urgency and frequency.       10/09/2011 on Cipro for UTI  Last dose scheduled for 10/16/2011  Musculoskeletal: Positive for joint pain.  Skin: Negative.   Neurological: Negative.   Endo/Heme/Allergies: Negative.   Psychiatric/Behavioral: Negative.     There were no vitals taken for this visit. Physical Exam  Constitutional: She is oriented to person, place, and time. She appears well-developed and well-nourished.  HENT:  Head: Normocephalic and atraumatic.  Eyes: EOM are normal. Pupils are equal, round, and reactive to light.  Neck: Neck supple.  Cardiovascular: Normal rate and regular rhythm.   Respiratory: Effort normal.  GI: Soft.  Genitourinary:         Not pertinent to current symptomatology therefore not examined.  Musculoskeletal:       Examination of her right great toe reveals dorsiflexion to 5 degrees plantarflexion 15 degrees with pain, overall alignment is satisfactory mild swelling is noted, crepitation noted as well. Exam of the other toes show no pain or deformity. Vascular exam: pulses 2+ and symmetric.  Neurological: She is alert and oriented to person, place, and time.  Skin: Skin is warm and dry.  Psychiatric: She has a normal mood and affect. Her behavior is normal. Judgment and thought content normal.     Assessment/Plan Patient Active Problem List  Diagnoses  . RESTLESS LEG SYNDROME  . ALLERGIC RHINITIS  . ASTHMA, INTERMITTENT, MILD  . GERD  . URTICARIA, ALLERGIC  . COUGH   Plan: The risks, benefits,  and possible complications of the procedure were discussed in detail with the patient.  The patient is without question.  Proceed with surgery.   Amelio Brosky J 10/09/2011, 4:47 PM    

## 2011-10-28 NOTE — Op Note (Signed)
NAME:  [name], [name]               ACCOUNT NO.:  [id number]  MEDICAL RECORD NO.:  [id number]  LOCATION:                                 FACILITY:  PHYSICIAN:  Elana Alm. Thurston Hole, M.D. DATE OF BIRTH:  03/19/1956  DATE OF PROCEDURE:  10/28/2011 DATE OF DISCHARGE:                              OPERATIVE REPORT   PREOPERATIVE DIAGNOSIS:  Right great toe hallux rigidus.  POSTOPERATIVE DIAGNOSIS:  Right great toe hallux rigidus.  PROCEDURE:  Right great toe dorsal cheilectomy.  SURGEON:  Elana Alm. Thurston Hole, MD  ANESTHESIA:  General.  OPERATIVE TIME:  45 minutes.  COMPLICATIONS:  None.  INDICATION FOR PROCEDURE:  [name] is a 55 year old woman who has had painful recurrent right great toe hallux rigidus this has failed multiple conservative modalities and is now to undergo resection of this dorsal spur over the dorsal cheilectomy.  DESCRIPTION:  [name] was brought to the operating room on October 28, 2011, after an ankle block had been placed in the holding room by Anesthesia.  She was placed on the operative table in supine position. She received antibiotics preoperatively for prophylaxis.  After being placed under general anesthesia, intraoperative fluoroscopy confirmed the large dorsal spur.  The right foot and leg were prepped using sterile DuraPrep and draped using sterile technique.  A 4-inch Esmarch was used as an ankle tourniquet.  Initially, through a 3-4 cm longitudinal incision based over the dorsal aspect of the great toe, initial exposure was made.  The underlying subcutaneous tissues were incised along with skin incision.  Dorsal cutaneous nerve was carefully protected while the underlying EHL tendon was exposed, carefully retracted while the dorsal capsule to the 1st MTP joint was incised longitudinally revealing the underlying large dorsal spur.  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 Carefully, retractors were placed medially and laterally, and then using an oscillating saw, the dorsal 30-40% of the distal in the 1st metatarsal along with this bone spur was resected.  Excellent complete resection was carried out and the great toe could be brought through a satisfactory range of motion with excellent excursion and no impingement.  After this was done, the wound was irrigated and then the dorsal capsule was closed with running 3-0 Vicryl, subcutaneous tissue was closed with 3-0 Vicryl, subcuticular layer closed with 4-0 Prolene. Sterile dressings were applied, and the patient was awakened and taken to recovery in stable condition.  Needle and sponge counts were correct x2 at the end of the case.  FOLLOWUP CARE:  [name] will be followed as an outpatient on Norco and Robaxin.  She will be seen back in my office in a week for wound check and followup.     Axtyn Woehler A. Thurston Hole, M.D.     RAW/MEDQ  D:  10/28/2011  T:  10/28/2011  Job:  [id number]

## 2011-10-28 NOTE — Progress Notes (Signed)
Assisted Dr. Frederick with right, ultrasound guided, popliteal/saphenous block. Side rails up, monitors on throughout procedure. See vital signs in flow sheet. Tolerated Procedure well. 

## 2011-10-28 NOTE — Anesthesia Postprocedure Evaluation (Signed)
Anesthesia Post Note  Patient: [name]  Procedure(s) Performed:  CHEILECTOMY - right great toe hallux rigidus with cheilectomy 1st metatarsophalangeal  Anesthesia type: General  Patient location: PACU  Post pain: Pain level controlled  Post assessment: Patient's Cardiovascular Status Stable  Last Vitals:  Filed Vitals:   10/28/11 1614  BP: 120/65  Pulse: 70  Temp: 36.4 C  Resp: 16    Post vital signs: Reviewed and stable  Level of consciousness: alert  Complications: No apparent anesthesia complications

## 2011-10-28 NOTE — Transfer of Care (Signed)
Immediate Anesthesia Transfer of Care Note  Patient: [name]  Procedure(s) Performed:  CHEILECTOMY - right great toe hallux rigidus with cheilectomy 1st metatarsophalangeal  Patient Location: PACU  Anesthesia Type: GA combined with regional for post-op pain  Level of Consciousness: awake, alert , oriented and patient cooperative  Airway & Oxygen Therapy: Patient Spontanous Breathing and Patient connected to face mask oxygen  Post-op Assessment: Report given to PACU RN and Post -op Vital signs reviewed and stable  Post vital signs: Reviewed and stable  Complications: No apparent anesthesia complications

## 2011-10-28 NOTE — Interval H&P Note (Signed)
History and Physical Interval Note:   10/28/2011   2:20 PM   [name]  has presented today for surgery, with the diagnosis of right great toe hallux rigidus  The various methods of treatment have been discussed with the patient and family. After consideration of risks, benefits and other options for treatment, the patient has consented to  Procedure(s): CHEILECTOMY as a surgical intervention .  The patients' history has been reviewed, patient examined, no change in status, stable for surgery.  I have reviewed the patients' chart and labs.  Questions were answered to the patient's satisfaction.     Nilda Simmer  MD

## 2011-10-30 ENCOUNTER — Encounter (HOSPITAL_BASED_OUTPATIENT_CLINIC_OR_DEPARTMENT_OTHER): Payer: Self-pay | Admitting: Orthopedic Surgery

## 2011-11-09 ENCOUNTER — Encounter (HOSPITAL_COMMUNITY): Payer: Self-pay | Admitting: Physician Assistant

## 2011-11-13 ENCOUNTER — Encounter (HOSPITAL_BASED_OUTPATIENT_CLINIC_OR_DEPARTMENT_OTHER): Payer: Self-pay

## 2011-11-21 ENCOUNTER — Ambulatory Visit (HOSPITAL_COMMUNITY): Payer: 59

## 2011-12-04 ENCOUNTER — Ambulatory Visit (HOSPITAL_COMMUNITY): Payer: 59 | Admitting: Physician Assistant

## 2011-12-25 ENCOUNTER — Ambulatory Visit (HOSPITAL_COMMUNITY)
Admission: RE | Admit: 2011-12-25 | Discharge: 2011-12-25 | Disposition: A | Discharge status: Home / Self Care | Payer: 59 | Source: Ambulatory Visit | Attending: Obstetrics and Gynecology | Admitting: Obstetrics and Gynecology

## 2011-12-25 DIAGNOSIS — Z1231 Encounter for screening mammogram for malignant neoplasm of breast: Secondary | ICD-10-CM | POA: Insufficient documentation

## 2012-01-05 ENCOUNTER — Ambulatory Visit (HOSPITAL_COMMUNITY): Payer: 59 | Admitting: Psychiatry

## 2012-01-06 ENCOUNTER — Other Ambulatory Visit (HOSPITAL_COMMUNITY): Payer: Self-pay | Admitting: Psychiatry

## 2012-01-07 ENCOUNTER — Encounter (HOSPITAL_COMMUNITY): Payer: Self-pay | Admitting: Psychiatry

## 2012-01-07 ENCOUNTER — Ambulatory Visit (INDEPENDENT_AMBULATORY_CARE_PROVIDER_SITE_OTHER): Payer: 59 | Admitting: Psychiatry

## 2012-01-07 VITALS — BP 110/68 | HR 70 | Ht 68.0 in | Wt 175.2 lb

## 2012-01-07 DIAGNOSIS — F39 Unspecified mood [affective] disorder: Secondary | ICD-10-CM

## 2012-01-07 MED ORDER — ARIPIPRAZOLE 5 MG PO TABS: 5.0000 mg | ORAL_TABLET | Freq: Every day | ORAL | Status: DC

## 2012-01-07 MED ORDER — FLUOXETINE HCL 40 MG PO CAPS: 40.0000 mg | ORAL_CAPSULE | Freq: Every day | ORAL | Status: DC

## 2012-01-07 NOTE — Progress Notes (Signed)
Chief complaint I still feel depressed  History of presenting illness Patient is 56 year old Caucasian married employed female who came for her followup appointment. Patient has seen by physician assistant in this office and this is the first time I am seeing her. Patient endorse long history of depression getting worse last year due to job stress and family issues. Her 62 year old daughter was smoking and being disrespectful. Her daughter is now living with her friend. Patient is still concerned about her. She is also very stressed about her job. She worked as a Film/video editor and recently change in job description and the responsibilities cause her increased anxiety and depression. Patient also lost her 62 year-old sister who died due to exacerbation of COPD in December 21, 2023. Patient told her Christmas was very stressful and sad. Patient admitted recently she is more frustrated irritable racing thoughts and poor sleep. Patient told she has difficulty falling asleep. Though she denies any active or passive suicidal thoughts paranoia or any hallucination but admitted tearful and crying spells. Patient has a good supportive husband however her other family member lives in [place]. Currently patient is taking Prozac 40 mg daily along with Abilify 2 mg. Patient has not seen much improvement since Abilify was added by our physician assistant.  Past psychiatric history Patient denies any previous history of psychiatric inpatient treatment. She denies any history of suicidal attempt paranoia or psychosis. She has been seeing therapist in this office however these visits are irregular.  Medical history Patient seen Richarda Overlie at [facility] physician. She has history of allergic rhinitis, urticaria, angioedema and asthma. She also has anemia for that she takes multivitamin  Family history Patient denies any family history of psychiatric illness  Alcohol and substance abuse history Patient admitted drinking  alcohol on weekend however she denies any binge drinking, intoxication, blackout or any withdrawal symptoms. Patient denies any illegal substance use. She denies any DWI.  Psychosocial history Patient was born and raised in [place], [place]. she has been married 3 times. She denies any history of physical sexual or emotional abuse. She lives with her husband. Her 18 year old daughter lives with her friend.  Mental status examination Patient is well groomed well dressed. She appears anxious but maintained good eye contact. Her speech is soft clear and coherent. Her thought process is logical linear and goal-directed She described her mood is anxious and depressed and her affect is constricted. She becomes tearful when she was talking about her sister who died in 21-Dec-2023 due to COPD. She denies any active or passive suicidal thoughts or homicidal thoughts. She denies any auditory or visual hallucination. There no paranoid thinking. There no psychotic symptoms present. Her attention and concentration is fair. She's alert and oriented x3. Her fund of knowledge is adequate. Her insight judgment and impulse control is okay.  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 At this time she is taking Abilify 10 mg without any side effects. I recommended to try 5 mg Abilify to target these symptoms. She will continue Prozac 40 mg daily. She will continue therapy for increase coping and social skills. I have explained risks and benefits of medication including the metabolic side effects and extrapyramidal side effects. I recommended to call us if she has any question or concern about the  medication or if she feels worsening of her symptoms. I will see her again in 2 months. Time spent 30 minutes

## 2012-02-29 ENCOUNTER — Other Ambulatory Visit (HOSPITAL_COMMUNITY): Payer: Self-pay | Admitting: Psychiatry

## 2012-03-01 ENCOUNTER — Ambulatory Visit (HOSPITAL_COMMUNITY): Payer: Self-pay | Admitting: Psychiatry

## 2012-12-07 NOTE — Progress Notes (Signed)
This encounter was created in error - please disregard.

## 2014-06-27 LAB — HM MAMMOGRAPHY

## 2014-06-27 LAB — HM PAP SMEAR: HM Pap smear: NORMAL

## 2014-08-03 ENCOUNTER — Ambulatory Visit (INDEPENDENT_AMBULATORY_CARE_PROVIDER_SITE_OTHER): Payer: 59 | Admitting: Internal Medicine

## 2014-08-03 ENCOUNTER — Encounter (INDEPENDENT_AMBULATORY_CARE_PROVIDER_SITE_OTHER): Payer: Self-pay

## 2014-08-03 ENCOUNTER — Encounter: Payer: Self-pay | Admitting: Internal Medicine

## 2014-08-03 VITALS — BP 110/80 | HR 81 | Ht 69.0 in | Wt 192.0 lb

## 2014-08-03 DIAGNOSIS — G4730 Sleep apnea, unspecified: Secondary | ICD-10-CM

## 2014-08-03 DIAGNOSIS — G2581 Restless legs syndrome: Secondary | ICD-10-CM

## 2014-08-03 DIAGNOSIS — G478 Other sleep disorders: Secondary | ICD-10-CM

## 2014-08-03 DIAGNOSIS — J45909 Unspecified asthma, uncomplicated: Secondary | ICD-10-CM

## 2014-08-03 DIAGNOSIS — G4733 Obstructive sleep apnea (adult) (pediatric): Secondary | ICD-10-CM | POA: Insufficient documentation

## 2014-08-03 NOTE — Assessment & Plan Note (Signed)
Controlled, despite an incidental very mild bronchitis at the time of this exam

## 2014-08-03 NOTE — Patient Instructions (Signed)
Order- Unattended home sleep study     Dx OSA with insomnia

## 2014-08-03 NOTE — Assessment & Plan Note (Signed)
History and exam suggest obstructive sleep apnea. There is also difficulty initiating and maintaining sleep which may be related to sleep apnea or maybe a separate problem reflecting stress with several job changes in the last few years. Plan-I think we can schedule an unattended sleep study and learn what we need to know. If her insurance prefers, we will schedule formal attended study.

## 2014-08-03 NOTE — Progress Notes (Signed)
08/03/14- 58 yoF never smoker last seen in 2012 for asthma, allergic rhinitis, complicated by GERD and hx of urticaria complicated by hx mood disorder.  Now wishing to re-establish/FOLLOWS FOR: Pt self referred for sleep apnea. Pt states she has day time sleepiness and is not sleeping well. Pt states her husband has witnessed periods of apnea during the night. Epworth-12 Snores a lot. Husband wakes her to get her breathing. No sleeping pills. Little caffeine. No naps area has gained 30 pounds in the last few years. Previous tonsillectomy. Currently on Z-Pak for incidental bronchitis. Bedtime 10 PM, sleep latency 15 minutes, waking 3 or 4 times during the night before up between 4:30 and 5 AM. CT chest 2011- WNL PFT 07/01/10- Borderline normal with slight airtrapping. FVC 3.55/ 86%, FEV12.79/ 86%,  FEV1/FVC 0.79, TLC 108%, RV 122%, DLCO 103%, no response to BD  Prior to Admission medications   Medication Sig Start Date End Date Taking? Authorizing Provider  ARIPiprazole (ABILIFY) 2 MG tablet Take 2 mg by mouth daily.   Yes Historical Provider, MD  clonazePAM (KLONOPIN) 1 MG tablet Take 1 mg by mouth 2 (two) times daily.   Yes Historical Provider, MD  desvenlafaxine (PRISTIQ) 100 MG 24 hr tablet Take 100 mg by mouth daily.   Yes Historical Provider, MD  fexofenadine (ALLEGRA) 180 MG tablet Take 180 mg by mouth daily.     Yes Historical Provider, MD  meloxicam (MOBIC) 15 MG tablet Take 15 mg by mouth daily.   Yes Historical Provider, MD  omeprazole (PRILOSEC) 20 MG capsule Take 20 mg by mouth daily.   Yes Historical Provider, MD  rizatriptan (MAXALT) 10 MG tablet Take 1 by mouth 1-2 times a week migraine onset    Yes Historical Provider, MD  topiramate (TOPAMAX) 100 MG tablet Take 200 mg by mouth at bedtime.    Yes Historical Provider, MD  FLUoxetine (PROZAC) 40 MG capsule Take 1 capsule (40 mg total) by mouth daily. 01/07/12 01/06/13  Kathlee Nations, MD  Fluticasone-Salmeterol (ADVAIR DISKUS) 100-50 MCG/DOSE  AEPB Inhale 1 puff into the lungs 2 (two) times daily. Rinse mouth 05/19/11 05/18/12  Deneise Lever, MD   Past Medical History  Diagnosis Date  . Allergic rhinitis   . Urticaria 1999  . Angioedema 1999  . Asthma    Past Surgical History  Procedure Laterality Date  . Cesarean section    . Shoulder arthroscopy w/ rotator cuff repair  2009  . Lateral epicondyle release  2007    right  . Shoulder arthroscopy w/ subacromial decompression and distal clavicle excision  2011    left  . 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 Cheilectomy  10/28/2011    Procedure: CHEILECTOMY;  Surgeon: Lorn Junes, MD;  Location: [facility];  Service: Orthopedics;  Laterality: Right;  right great toe hallux rigidus with cheilectomy 1st metatarsophalangeal  . Bunionectomy  2010   Family History  Problem Relation Age of Onset  . COPD Father   . Heart disease Mother   . Arthritis Father   . Emphysema Father   . COPD      sibling   . Arthritis      sibling   History   Social History  . Marital Status: Married    Spouse Name: N/A    Number of Children: N/A  . Years of Education: N/A   Occupational History  . RN    Social History Main Topics  .  Smoking status: Never Smoker   . Smokeless tobacco: Never Used  . Alcohol Use: 1.2 oz/week    2 Glasses of wine per week  . Drug Use: No  . Sexual Activity: Yes    Birth Control/ Protection: Surgical   Other Topics Concern  . Not on file   Social History Narrative   Positive history of passive tobacco smoke exposure.         ROS-see HPI Constitutional:   No-   weight loss, night sweats, fevers, chills, +fatigue, lassitude. HEENT:   No-  headaches, difficulty swallowing, tooth/dental problems, sore throat,       No-  sneezing, itching, ear ache, nasal congestion, post nasal drip,  CV:  No-   chest pain, orthopnea, PND, swelling in lower extremities, anasarca,                                  dizziness, palpitations Resp: No-    shortness of breath with exertion or at rest.              No-   productive cough,  No non-productive cough,  No- coughing up of blood.              No-   change in color of mucus.  No- wheezing.   Skin: No-   rash or lesions. GI:  No-   heartburn, indigestion, abdominal pain, nausea, vomiting, diarrhea,                 change in bowel habits, loss of appetite GU: No-   dysuria, change in color of urine, no urgency or frequency.  No- flank pain. MS:  No-   joint pain or swelling.  No- decreased range of motion.  No- back pain. Neuro-     nothing unusual Psych:  No- change in mood or affect. No depression or anxiety.  No memory loss.  OBJ- Physical Exam General- Alert, Oriented, Affect-appropriate, Distress- none acute, overweight Skin- rash-none, lesions- none, excoriation- none Lymphadenopathy- none Head- atraumatic            Eyes- Gross vision intact, PERRLA, conjunctivae and secretions clear            Ears- Hearing, canals-normal            Nose- Clear, no-Septal dev, mucus, polyps, erosion, perforation             Throat- Mallampati II-III , mucosa clear , drainage- none, tonsils- atrophic Neck- flexible , trachea midline, no stridor , thyroid nl, carotid no bruit Chest - symmetrical excursion , unlabored           Heart/CV- RRR , no murmur , no gallop  , no rub, nl s1 s2                           - JVD- none , edema- none, stasis changes- none, varices- none           Lung- clear to P&A, wheeze- none, cough- none , dullness-none, rub- none           Chest wall-  Abd- tender-no, distended-no, bowel sounds-present, HSM- no Br/ Gen/ Rectal- Not done, not indicated Extrem- cyanosis- none, clubbing, none, atrophy- none, strength- nl Neuro- grossly intact to observation

## 2014-08-24 ENCOUNTER — Telehealth: Payer: Self-pay | Admitting: Internal Medicine

## 2014-08-24 NOTE — Telephone Encounter (Signed)
[facility] please advise on the pts home sleep test.  thanks

## 2014-08-28 NOTE — Telephone Encounter (Signed)
Pt aware she will be called 4-5wks after order she will be called sometime next week-week of 09/05/14 [name]

## 2014-09-12 DIAGNOSIS — G4733 Obstructive sleep apnea (adult) (pediatric): Secondary | ICD-10-CM

## 2014-09-20 DIAGNOSIS — G4733 Obstructive sleep apnea (adult) (pediatric): Secondary | ICD-10-CM

## 2014-09-21 ENCOUNTER — Encounter: Payer: Self-pay | Admitting: Internal Medicine

## 2014-09-21 ENCOUNTER — Ambulatory Visit (INDEPENDENT_AMBULATORY_CARE_PROVIDER_SITE_OTHER): Payer: 59 | Admitting: Internal Medicine

## 2014-09-21 VITALS — BP 100/80 | HR 80 | Ht 69.0 in | Wt 201.0 lb

## 2014-09-21 DIAGNOSIS — G4733 Obstructive sleep apnea (adult) (pediatric): Secondary | ICD-10-CM

## 2014-09-21 DIAGNOSIS — J4520 Mild intermittent asthma, uncomplicated: Secondary | ICD-10-CM

## 2014-09-21 NOTE — Progress Notes (Signed)
08/03/14- 87 yoF never smoker last seen in 2012 for asthma, allergic rhinitis, complicated by GERD and hx of urticaria complicated by hx mood disorder.  Now wishing to re-establish/FOLLOWS FOR: Pt self referred for sleep apnea. Pt states she has day time sleepiness and is not sleeping well. Pt states her husband has witnessed periods of apnea during the night. Epworth-12 Snores a lot. Husband wakes her to get her breathing. No sleeping pills. Little caffeine. No naps area has gained 30 pounds in the last few years. Previous tonsillectomy. Currently on Z-Pak for incidental bronchitis. Bedtime 10 PM, sleep latency 15 minutes, waking 3 or 4 times during the night before up between 4:30 and 5 AM. CT chest 2011- WNL PFT 07/01/10- Borderline normal with slight airtrapping. FVC 3.55/ 86%, FEV12.79/ 86%,  FEV1/FVC 0.79, TLC 108%, RV 122%, DLCO 103%, no response to BD  09/21/14-58 yoF never smoker last seen in 2012 for asthma, allergic rhinitis, complicated by GERD and hx of urticaria complicated by hx mood disorder. FOLLOWS FOR: patient reports no change in sleep patterns. She is here today for sleep study results.  Unattended home sleep study 09/12/14- Mild OSA, AHI 12.1/ hr, weight 192 lbs. Discussed options of CPAP and Oral appliance.  ROS-see HPI Constitutional:   No-   weight loss, night sweats, fevers, chills, +fatigue, lassitude. HEENT:   No-  headaches, difficulty swallowing, tooth/dental problems, sore throat,       No-  sneezing, itching, ear ache, nasal congestion, post nasal drip,  CV:  No-   chest pain, orthopnea, PND, swelling in lower extremities, anasarca,                                  dizziness, palpitations Resp: No-   shortness of breath with exertion or at rest.              No-   productive cough,  No non-productive cough,  No- coughing up of blood.              No-   change in color of mucus.  No- wheezing.   Skin: No-   rash or lesions. GI:  No-   heartburn, indigestion, abdominal  pain, nausea, vomiting, GU:  MS:  No-   joint pain or swelling. . Neuro-     nothing unusual Psych:  No- change in mood or affect. No depression or anxiety.  No memory loss.  OBJ- Physical Exam General- Alert, Oriented, Affect-appropriate, Distress- none acute, overweight Skin- rash-none, lesions- none, excoriation- none Lymphadenopathy- none Head- atraumatic            Eyes- Gross vision intact, PERRLA, conjunctivae and secretions clear            Ears- Hearing, canals-normal            Nose- Clear, no-Septal dev, mucus, polyps, erosion, perforation             Throat- Mallampati II-III , mucosa clear , drainage- none, tonsils- atrophic Neck- flexible , trachea midline, no stridor , thyroid nl, carotid no bruit Chest - symmetrical excursion , unlabored           Heart/CV- RRR , no murmur , no gallop  , no rub, nl s1 s2                           - JVD- none , edema-  none, stasis changes- none, varices- none           Lung- clear to P&A, wheeze- none, cough- none , dullness-none, rub- none           Chest wall-  Abd-  Br/ Gen/ Rectal- Not done, not indicated Extrem- cyanosis- none, clubbing, none, atrophy- none, strength- nl Neuro- grossly intact to observation

## 2014-09-21 NOTE — Patient Instructions (Signed)
New CPAP set up-auto titrate 5-20 cwp x 2 weeks, mask of choice, humidifer, supplies  Dx OSA  Refer to Orthodontics Dr Rodney Langton oral appliance  DX OSA

## 2014-09-24 NOTE — Assessment & Plan Note (Signed)
Good control

## 2014-09-24 NOTE — Assessment & Plan Note (Signed)
Mild OSA. We discussed physiology and options.She would like to look into both CPAP and oral appliance before choosing. Plan- initiate CPAP auto, but also refer to Dr Ron Parker [facility] [place]

## 2014-11-13 ENCOUNTER — Ambulatory Visit: Payer: Self-pay | Admitting: Internal Medicine

## 2014-11-14 ENCOUNTER — Telehealth: Payer: Self-pay | Admitting: Internal Medicine

## 2014-11-14 MED ORDER — AZITHROMYCIN 250 MG PO TABS: ORAL_TABLET | ORAL | Status: DC

## 2014-11-14 NOTE — Telephone Encounter (Signed)
Spoke with pt. Reports having the starts of bronchitis. Cough is dry. Chest tightness is present. Denies SOB, wheezing or fever. Wants Zpack called in to [facility].  No Known Allergies  CY - please advise. Thanks.

## 2014-11-14 NOTE — Telephone Encounter (Signed)
Called and spoke to pt. Informed pt of the recs per CY. Rx sent to preferred pharmacy. Pt verbalized understanding and denied any further questions or concerns at this time.  

## 2014-11-14 NOTE — Telephone Encounter (Signed)
Ok send Z pak

## 2014-12-19 ENCOUNTER — Ambulatory Visit: Payer: Self-pay | Admitting: Orthopedic Surgery

## 2014-12-28 ENCOUNTER — Encounter: Payer: Self-pay | Admitting: Internal Medicine

## 2014-12-28 ENCOUNTER — Ambulatory Visit (INDEPENDENT_AMBULATORY_CARE_PROVIDER_SITE_OTHER): Payer: 59 | Admitting: Internal Medicine

## 2014-12-28 VITALS — BP 124/62 | HR 79 | Ht 69.0 in | Wt 207.4 lb

## 2014-12-28 DIAGNOSIS — G4733 Obstructive sleep apnea (adult) (pediatric): Secondary | ICD-10-CM

## 2014-12-28 DIAGNOSIS — J4520 Mild intermittent asthma, uncomplicated: Secondary | ICD-10-CM

## 2014-12-28 MED ORDER — ALBUTEROL SULFATE 108 (90 BASE) MCG/ACT IN AEPB: 2.0000 | INHALATION_SPRAY | Freq: Four times a day (QID) | RESPIRATORY_TRACT | Status: DC | PRN

## 2014-12-28 NOTE — Progress Notes (Signed)
08/03/14- 6 yoF never smoker last seen in 2012 for asthma, allergic rhinitis, complicated by GERD and hx of urticaria complicated by hx mood disorder.  Now wishing to re-establish/FOLLOWS FOR: Pt self referred for sleep apnea. Pt states she has day time sleepiness and is not sleeping well. Pt states her husband has witnessed periods of apnea during the night. Epworth-12 Snores a lot. Husband wakes her to get her breathing. No sleeping pills. Little caffeine. No naps area has gained 30 pounds in the last few years. Previous tonsillectomy. Currently on Z-Pak for incidental bronchitis. Bedtime 10 PM, sleep latency 15 minutes, waking 3 or 4 times during the night before up between 4:30 and 5 AM. CT chest 2011- WNL PFT 07/01/10- Borderline normal with slight airtrapping. FVC 3.55/ 86%, FEV12.79/ 86%,  FEV1/FVC 0.79, TLC 108%, RV 122%, DLCO 103%, no response to BD  09/21/14-58 yoF never smoker last seen in 2012 for asthma, allergic rhinitis, complicated by GERD and hx of urticaria complicated by hx mood disorder. FOLLOWS FOR: patient reports no change in sleep patterns. She is here today for sleep study results.  Unattended home sleep study 09/12/14- Mild OSA, AHI 12.1/ hr, weight 192 lbs. Discussed options of CPAP and Oral appliance.  12/28/14- 50 yoF never smoker followed for OSA,  asthma, allergic rhinitis, urticaria, complicated by GERD, hx mood disorder. At last visit we started parallel CPAP and referral to Dr Ron Parker for oral appliance consideration. FOLLOWS FOR: Feels that CPAP Auto / Advanced has helped since being set up through [facility].  She chose to try CPAP and says she is now sleeping "great" and her husband tells her she is not snoring. Nasal pillows mask is comfortable. Asthma: For a mild intermittent symptoms, well controlled. She would like to keep her rescue inhaler available but did not use up one all last year. She is pending meniscus surgery on her knee.   ROS-see HPI Constitutional:   No-    weight loss, night sweats, fevers, chills, fatigue, lassitude. HEENT:   No-  headaches, difficulty swallowing, tooth/dental problems, sore throat,       No-  sneezing, itching, ear ache, nasal congestion, post nasal drip,  CV:  No-   chest pain, orthopnea, PND, swelling in lower extremities, anasarca,                                  dizziness, palpitations Resp: No-   shortness of breath with exertion or at rest.              No-   productive cough,  No non-productive cough,  No- coughing up of blood.              No-   change in color of mucus.  No- wheezing.   Skin: No-   rash or lesions. GI:  No-   heartburn, indigestion, abdominal pain, nausea, vomiting, GU:  MS:  No-   joint pain or swelling. . Neuro-     nothing unusual Psych:  No- change in mood or affect. No depression or anxiety.  No memory loss.  OBJ- Physical Exam General- Alert, Oriented, Affect-appropriate, Distress- none acute, overweight Skin- rash-none, lesions- none, excoriation- none Lymphadenopathy- none Head- atraumatic            Eyes- Gross vision intact, PERRLA, conjunctivae and secretions clear            Ears- Hearing, canals-normal  Nose- Clear, no-Septal dev, mucus, polyps, erosion, perforation             Throat- Mallampati II-III , mucosa clear , drainage- none, tonsils- atrophic Neck- flexible , trachea midline, no stridor , thyroid nl, carotid no bruit Chest - symmetrical excursion , unlabored           Heart/CV- RRR , no murmur , no gallop  , no rub, nl s1 s2                           - JVD- none , edema- none, stasis changes- none, varices- none           Lung- clear to P&A, wheeze- none, cough- none , dullness-none, rub- none           Chest wall-  Abd-  Br/ Gen/ Rectal- Not done, not indicated Extrem- cyanosis- none, clubbing, none, atrophy- none, strength- nl Neuro- grossly intact to observation

## 2014-12-28 NOTE — Patient Instructions (Signed)
Card, script and instruction Proair RespiClick rescue inhaler         2 puffs every 4-6 hours as needed  Order- DME Advanced Change CPAP to Auto 10-15 cwp    Dx OSA  I see no special concerns for your anticipated surgery.  You can wear CPAP 10-15 cwp while sedated for surgery, and for sleep as before.

## 2014-12-28 NOTE — Assessment & Plan Note (Signed)
Excellent symptomatic control with AutoPap. We discussed options and are going to leave auto titration but narrowed the brackets so her pressure range is 10-15.

## 2014-12-28 NOTE — Addendum Note (Signed)
Addended by: Baird Lyons D on: 12/28/2014 09:30 PM   Modules accepted: Miquel Dunn

## 2014-12-28 NOTE — Assessment & Plan Note (Signed)
Plan-we are refilling her rescue inhaler with card and prescription for Proair Respiclick.

## 2015-01-10 ENCOUNTER — Encounter (HOSPITAL_BASED_OUTPATIENT_CLINIC_OR_DEPARTMENT_OTHER): Payer: Self-pay | Admitting: *Deleted

## 2015-01-15 ENCOUNTER — Encounter (HOSPITAL_BASED_OUTPATIENT_CLINIC_OR_DEPARTMENT_OTHER): Payer: Self-pay | Admitting: Physician Assistant

## 2015-01-15 DIAGNOSIS — S83242A Other tear of medial meniscus, current injury, left knee, initial encounter: Secondary | ICD-10-CM | POA: Diagnosis present

## 2015-01-15 NOTE — H&P (Signed)
[name] is an 59 y.o. female.   Chief Complaint: left knee pain HPI: [name] is a 59 year old seen for acute onset of left knee pain that began 2 months ago no specific injury. Pain with catching swelling and popping in the left knee worse with standing and relived by rest.her left knee MRI that revealed a medial meniscus tear  Past Medical History  Diagnosis Date  . Allergic rhinitis   . Urticaria 1999  . Angioedema 1999  . Asthma   . Sleep apnea     uses CPAP nightly  . Anxiety   . Depression   . GERD (gastroesophageal reflux disease)   . Arthritis   . Headache     migraines  . Acute medial meniscus tear of left knee     Past Surgical History  Procedure Laterality Date  . Cesarean section    . Shoulder arthroscopy w/ rotator cuff repair  2009  . Lateral epicondyle release  2007    right  . Shoulder arthroscopy w/ subacromial decompression and distal clavicle excision  2011    left  . 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 Cheilectomy  10/28/2011    Procedure: CHEILECTOMY;  Surgeon: Lorn Junes, MD;  Location: [place];  Service: Orthopedics;  Laterality: Right;  right great toe hallux rigidus with cheilectomy 1st metatarsophalangeal  . Bunionectomy  2010  . Tubal ligation    . Tonsillectomy      Family History  Problem Relation Age of Onset  . COPD Father   . Arthritis Father   . Emphysema Father   . Sleep apnea Father   . Heart disease Mother   . COPD      sibling   . Arthritis      sibling   Social History:  reports that she has never smoked. She has never used smokeless tobacco. She reports that she drinks about 1.2 oz of alcohol per week. She reports that she does not use illicit drugs.  Allergies: No Known Allergies  No current facility-administered medications for this encounter.  Current outpatient prescriptions:  .  ARIPiprazole (ABILIFY) 2 MG tablet, Take 2 mg by mouth daily., Disp: , Rfl:  .  clonazePAM (KLONOPIN) 1 MG  tablet, Take 1 mg by mouth 2 (two) times daily., Disp: , Rfl:  .  desvenlafaxine (PRISTIQ) 100 MG 24 hr tablet, Take 100 mg by mouth daily., Disp: , Rfl:  .  fexofenadine (ALLEGRA) 180 MG tablet, Take 180 mg by mouth daily.  , Disp: , Rfl:  .  Fluticasone-Salmeterol (ADVAIR DISKUS) 100-50 MCG/DOSE AEPB, Inhale 1 puff into the lungs 2 (two) times daily. Rinse mouth, Disp: 3 each, Rfl: 3 .  meloxicam (MOBIC) 15 MG tablet, Take 15 mg by mouth daily., Disp: , Rfl:  .  omeprazole (PRILOSEC) 20 MG capsule, Take 20 mg by mouth daily., Disp: , Rfl:  .  topiramate (TOPAMAX) 100 MG tablet, Take 200 mg by mouth at bedtime. , Disp: , Rfl:  .  Albuterol Sulfate (PROAIR RESPICLICK) 761 (90 BASE) MCG/ACT AEPB, Inhale 2 puffs into the lungs 4 (four) times daily as needed., Disp: 1 each, Rfl: prn .  rizatriptan (MAXALT) 10 MG tablet, Take 1 by mouth 1-2 times a week migraine onset , Disp: , Rfl:   No results found for this or any previous visit (from the past 48 hour(s)). No results found.  Review of Systems  Constitutional: Negative.   HENT:  Negative.   Eyes: Negative.   Respiratory: Negative.   Cardiovascular: Negative.   Gastrointestinal: Negative.   Genitourinary: Negative.   Musculoskeletal: Positive for joint pain.       Left knee medial meniscus tear  Skin: Negative.   Neurological: Negative.   Endo/Heme/Allergies: Negative.   Psychiatric/Behavioral: Negative.     Height 5\' 9"  (1.753 m), weight 92.987 kg (205 lb), last menstrual period 12/17/2011. Physical Exam  Constitutional: She is oriented to person, place, and time. She appears well-developed and well-nourished.  HENT:  Head: Normocephalic and atraumatic.  Mouth/Throat: Oropharynx is clear and moist.  Eyes: Conjunctivae and EOM are normal. Pupils are equal, round, and reactive to light.  Neck: Neck supple.  Cardiovascular: Normal rate.   Respiratory: Effort normal.  GI: Soft.  Genitourinary:  Not pertinent to current  symptomatology therefore not examined.  Musculoskeletal:  Examination of her left knee reveals pain medially and posteriorly, 1+ synovitis full range of motion positive medial McMurray's knee stable to ligamentous exam with normal patella tracking. Right knee reveals mild pain minimal swelling knee is stable with normal patella tracking. Vascular exam: pulses 2+ and symmetric.   Neurological: She is alert and oriented to person, place, and time.  Skin: Skin is warm and dry.  Psychiatric: She has a normal mood and affect. Her behavior is normal.     Assessment Principal Problem:   Acute medial meniscus tear of left knee Active Problems:   RESTLESS LEG SYNDROME   Asthma, mild intermittent, well-controlled   GERD   Obstructive sleep apnea  Plan Left knee medial mensicus tear.  I told her with this finding and her significant persistent pain I recommend we proceed with a left knee arthroscopy with attention to her meniscal pathology. Discussed risks benefits and possible complications of the surgery in detail and she understands this completely. We'll plan on setting her up for this at some point in the near future.  Coren Crownover J 01/15/2015, 2:29 PM

## 2015-01-16 ENCOUNTER — Encounter (HOSPITAL_BASED_OUTPATIENT_CLINIC_OR_DEPARTMENT_OTHER)
Admission: RE | Disposition: A | Discharge status: Home / Self Care | Payer: Self-pay | Source: Ambulatory Visit | Attending: Orthopedic Surgery

## 2015-01-16 ENCOUNTER — Ambulatory Visit (HOSPITAL_BASED_OUTPATIENT_CLINIC_OR_DEPARTMENT_OTHER)
Admission: RE | Admit: 2015-01-16 | Discharge: 2015-01-16 | Disposition: A | Discharge status: Home / Self Care | Payer: 59 | Source: Ambulatory Visit | Attending: Orthopedic Surgery | Admitting: Orthopedic Surgery

## 2015-01-16 ENCOUNTER — Encounter (HOSPITAL_BASED_OUTPATIENT_CLINIC_OR_DEPARTMENT_OTHER): Payer: Self-pay | Admitting: Certified Registered"

## 2015-01-16 ENCOUNTER — Ambulatory Visit (HOSPITAL_BASED_OUTPATIENT_CLINIC_OR_DEPARTMENT_OTHER): Payer: 59 | Admitting: Certified Registered"

## 2015-01-16 DIAGNOSIS — J4530 Mild persistent asthma, uncomplicated: Secondary | ICD-10-CM | POA: Diagnosis present

## 2015-01-16 DIAGNOSIS — G2581 Restless legs syndrome: Secondary | ICD-10-CM | POA: Diagnosis present

## 2015-01-16 DIAGNOSIS — M179 Osteoarthritis of knee, unspecified: Secondary | ICD-10-CM | POA: Diagnosis not present

## 2015-01-16 DIAGNOSIS — J45909 Unspecified asthma, uncomplicated: Secondary | ICD-10-CM | POA: Diagnosis not present

## 2015-01-16 DIAGNOSIS — K219 Gastro-esophageal reflux disease without esophagitis: Secondary | ICD-10-CM | POA: Diagnosis not present

## 2015-01-16 DIAGNOSIS — M2242 Chondromalacia patellae, left knee: Secondary | ICD-10-CM | POA: Diagnosis not present

## 2015-01-16 DIAGNOSIS — F419 Anxiety disorder, unspecified: Secondary | ICD-10-CM | POA: Diagnosis not present

## 2015-01-16 DIAGNOSIS — Z9851 Tubal ligation status: Secondary | ICD-10-CM | POA: Insufficient documentation

## 2015-01-16 DIAGNOSIS — F329 Major depressive disorder, single episode, unspecified: Secondary | ICD-10-CM | POA: Diagnosis not present

## 2015-01-16 DIAGNOSIS — S83242A Other tear of medial meniscus, current injury, left knee, initial encounter: Secondary | ICD-10-CM | POA: Diagnosis present

## 2015-01-16 DIAGNOSIS — G4733 Obstructive sleep apnea (adult) (pediatric): Secondary | ICD-10-CM | POA: Diagnosis present

## 2015-01-16 DIAGNOSIS — G43909 Migraine, unspecified, not intractable, without status migrainosus: Secondary | ICD-10-CM | POA: Insufficient documentation

## 2015-01-16 DIAGNOSIS — G4730 Sleep apnea, unspecified: Secondary | ICD-10-CM | POA: Insufficient documentation

## 2015-01-16 DIAGNOSIS — M23222 Derangement of posterior horn of medial meniscus due to old tear or injury, left knee: Secondary | ICD-10-CM | POA: Diagnosis present

## 2015-01-16 HISTORY — DX: Gastro-esophageal reflux disease without esophagitis: K21.9

## 2015-01-16 HISTORY — DX: Other tear of medial meniscus, current injury, left knee, initial encounter: S83.242A

## 2015-01-16 HISTORY — DX: Headache, unspecified: R51.9

## 2015-01-16 HISTORY — PX: CHONDROPLASTY: SHX5177

## 2015-01-16 HISTORY — DX: Major depressive disorder, single episode, unspecified: F32.9

## 2015-01-16 HISTORY — DX: Anxiety disorder, unspecified: F41.9

## 2015-01-16 HISTORY — DX: Sleep apnea, unspecified: G47.30

## 2015-01-16 HISTORY — DX: Headache: R51

## 2015-01-16 HISTORY — DX: Depression, unspecified: F32.A

## 2015-01-16 HISTORY — DX: Unspecified osteoarthritis, unspecified site: M19.90

## 2015-01-16 HISTORY — PX: KNEE ARTHROSCOPY WITH MEDIAL MENISECTOMY: SHX5651

## 2015-01-16 LAB — POCT HEMOGLOBIN-HEMACUE: Hemoglobin: 13.6 g/dL (ref 12.0–15.0)

## 2015-01-16 SURGERY — ARTHROSCOPY, KNEE, WITH MEDIAL MENISCECTOMY
Anesthesia: General | Site: Knee | Laterality: Left

## 2015-01-16 MED ORDER — HYDROCODONE-ACETAMINOPHEN 5-325 MG PO TABS: ORAL_TABLET | ORAL | Status: DC

## 2015-01-16 MED ORDER — CEFAZOLIN SODIUM-DEXTROSE 2-3 GM-% IV SOLR
2.0000 g | INTRAVENOUS | Status: AC
  Administered 2015-01-16: 2 g via INTRAVENOUS

## 2015-01-16 MED ORDER — PROPOFOL 10 MG/ML IV BOLUS
INTRAVENOUS | Status: DC | PRN
  Administered 2015-01-16: 200 mg via INTRAVENOUS
  Administered 2015-01-16: 50 mg via INTRAVENOUS

## 2015-01-16 MED ORDER — ONDANSETRON HCL 4 MG/2ML IJ SOLN: 4.0000 mg | Freq: Once | INTRAMUSCULAR | Status: DC | PRN

## 2015-01-16 MED ORDER — MIDAZOLAM HCL 2 MG/2ML IJ SOLN
1.0000 mg | INTRAMUSCULAR | Status: DC | PRN
  Administered 2015-01-16: 2 mg via INTRAVENOUS

## 2015-01-16 MED ORDER — HYDROMORPHONE HCL 1 MG/ML IJ SOLN
0.2500 mg | INTRAMUSCULAR | Status: DC | PRN
  Administered 2015-01-16 (×2): 0.5 mg via INTRAVENOUS

## 2015-01-16 MED ORDER — FENTANYL CITRATE 0.05 MG/ML IJ SOLN
INTRAMUSCULAR | Status: DC | PRN
  Administered 2015-01-16: 25 ug via INTRAVENOUS

## 2015-01-16 MED ORDER — LACTATED RINGERS IV SOLN
INTRAVENOUS | Status: DC
  Administered 2015-01-16 (×2): via INTRAVENOUS

## 2015-01-16 MED ORDER — FENTANYL CITRATE 0.05 MG/ML IJ SOLN
INTRAMUSCULAR | Status: AC
  Filled 2015-01-16: qty 6

## 2015-01-16 MED ORDER — OXYCODONE HCL 5 MG/5ML PO SOLN: 5.0000 mg | Freq: Once | ORAL | Status: DC | PRN

## 2015-01-16 MED ORDER — DEXAMETHASONE SODIUM PHOSPHATE 4 MG/ML IJ SOLN
INTRAMUSCULAR | Status: DC | PRN
  Administered 2015-01-16: 10 mg via INTRAVENOUS

## 2015-01-16 MED ORDER — CEFAZOLIN SODIUM-DEXTROSE 2-3 GM-% IV SOLR
INTRAVENOUS | Status: AC
  Filled 2015-01-16: qty 50

## 2015-01-16 MED ORDER — FENTANYL CITRATE 0.05 MG/ML IJ SOLN
50.0000 ug | INTRAMUSCULAR | Status: DC | PRN
  Administered 2015-01-16: 100 ug via INTRAVENOUS

## 2015-01-16 MED ORDER — MIDAZOLAM HCL 2 MG/2ML IJ SOLN
INTRAMUSCULAR | Status: AC
  Filled 2015-01-16: qty 2

## 2015-01-16 MED ORDER — ONDANSETRON HCL 4 MG/2ML IJ SOLN
INTRAMUSCULAR | Status: DC | PRN
  Administered 2015-01-16: 4 mg via INTRAVENOUS

## 2015-01-16 MED ORDER — FENTANYL CITRATE 0.05 MG/ML IJ SOLN
INTRAMUSCULAR | Status: AC
  Filled 2015-01-16: qty 2

## 2015-01-16 MED ORDER — LIDOCAINE HCL (CARDIAC) 20 MG/ML IV SOLN
INTRAVENOUS | Status: DC | PRN
  Administered 2015-01-16: 80 mg via INTRAVENOUS

## 2015-01-16 MED ORDER — HYDROMORPHONE HCL 1 MG/ML IJ SOLN
INTRAMUSCULAR | Status: AC
  Filled 2015-01-16: qty 1

## 2015-01-16 MED ORDER — OXYCODONE HCL 5 MG PO TABS
5.0000 mg | ORAL_TABLET | Freq: Once | ORAL | Status: DC | PRN
Start: 2015-01-16 — End: 2015-01-16

## 2015-01-16 MED ORDER — CHLORHEXIDINE GLUCONATE 4 % EX LIQD: 60.0000 mL | Freq: Once | CUTANEOUS | Status: DC

## 2015-01-16 SURGICAL SUPPLY — 43 items
BANDAGE ELASTIC 6 VELCRO ST LF (GAUZE/BANDAGES/DRESSINGS) ×3 IMPLANT
BLADE CUTTER GATOR 3.5 (BLADE) ×2 IMPLANT
BLADE GREAT WHITE 4.2 (BLADE) ×1 IMPLANT
BLADE GREAT WHITE 4.2MM (BLADE) ×1
BLADE SURG 15 STRL LF DISP TIS (BLADE) IMPLANT
BLADE SURG 15 STRL SS (BLADE)
BNDG COHESIVE 4X5 TAN STRL (GAUZE/BANDAGES/DRESSINGS) ×2 IMPLANT
DRAPE ARTHROSCOPY W/POUCH 90 (DRAPES) ×3 IMPLANT
DURAPREP 26ML APPLICATOR (WOUND CARE) ×3 IMPLANT
GAUZE SPONGE 4X4 12PLY STRL (GAUZE/BANDAGES/DRESSINGS) ×3 IMPLANT
GAUZE XEROFORM 1X8 LF (GAUZE/BANDAGES/DRESSINGS) ×3 IMPLANT
GLOVE BIO SURGEON STRL SZ 6.5 (GLOVE) ×1 IMPLANT
GLOVE BIO SURGEON STRL SZ7 (GLOVE) ×3 IMPLANT
GLOVE BIO SURGEONS STRL SZ 6.5 (GLOVE) ×1
GLOVE BIOGEL PI IND STRL 7.0 (GLOVE) ×1 IMPLANT
GLOVE BIOGEL PI IND STRL 7.5 (GLOVE) ×1 IMPLANT
GLOVE BIOGEL PI INDICATOR 7.0 (GLOVE) ×4
GLOVE BIOGEL PI INDICATOR 7.5 (GLOVE) ×2
GLOVE SS BIOGEL STRL SZ 7.5 (GLOVE) ×1 IMPLANT
GLOVE SUPERSENSE BIOGEL SZ 7.5 (GLOVE) ×2
GOWN STRL REUS W/ TWL LRG LVL3 (GOWN DISPOSABLE) ×3 IMPLANT
GOWN STRL REUS W/TWL LRG LVL3 (GOWN DISPOSABLE) ×9
HOLDER KNEE FOAM BLUE (MISCELLANEOUS) ×3 IMPLANT
KNEE WRAP E Z 3 GEL PACK (MISCELLANEOUS) ×3 IMPLANT
MANIFOLD NEPTUNE II (INSTRUMENTS) ×2 IMPLANT
NDL SAFETY ECLIPSE 18X1.5 (NEEDLE) ×2 IMPLANT
NEEDLE HYPO 18GX1.5 SHARP (NEEDLE)
NEEDLE HYPO 22GX1.5 SAFETY (NEEDLE) IMPLANT
PACK ARTHROSCOPY DSU (CUSTOM PROCEDURE TRAY) ×3 IMPLANT
PACK BASIN DAY SURGERY FS (CUSTOM PROCEDURE TRAY) ×3 IMPLANT
PAD ALCOHOL SWAB (MISCELLANEOUS) IMPLANT
SET ARTHROSCOPY TUBING (MISCELLANEOUS) ×3
SET ARTHROSCOPY TUBING LN (MISCELLANEOUS) ×1 IMPLANT
SUCTION FRAZIER TIP 10 FR DISP (SUCTIONS) IMPLANT
SUT ETHILON 4 0 PS 2 18 (SUTURE) ×3 IMPLANT
SUT PROLENE 3 0 PS 2 (SUTURE) IMPLANT
SUT VIC AB 3-0 PS1 18 (SUTURE)
SUT VIC AB 3-0 PS1 18XBRD (SUTURE) IMPLANT
SYR 20CC LL (SYRINGE) IMPLANT
SYR 5ML LL (SYRINGE) ×3 IMPLANT
TOWEL OR 17X24 6PK STRL BLUE (TOWEL DISPOSABLE) ×3 IMPLANT
WAND STAR VAC 90 (SURGICAL WAND) IMPLANT
WATER STERILE IRR 1000ML POUR (IV SOLUTION) ×3 IMPLANT

## 2015-01-16 NOTE — Interval H&P Note (Signed)
History and Physical Interval Note:  01/16/2015 12:02 PM  [name]  has presented today for surgery, with the diagnosis of BUCKET-HANDLE TEAR OF  MEDIAL MENISCUS/CURRENT INJURY UNSPECIFIED KNEE/INITIAL ENCOUNTER LEFT KNEE  The various methods of treatment have been discussed with the patient and family. After consideration of risks, benefits and other options for treatment, the patient has consented to  Procedure(s): LEFT KNEE SCOPE MEDIAL MENISECTOMY (Left) as a surgical intervention .  The patient's history has been reviewed, patient examined, no change in status, stable for surgery.  I have reviewed the patient's chart and labs.  Questions were answered to the patient's satisfaction.     [name]

## 2015-01-16 NOTE — Anesthesia Preprocedure Evaluation (Addendum)
Anesthesia Evaluation  Patient identified by MRN, date of birth, ID band Patient awake    Reviewed: Allergy & Precautions, NPO status , Patient's Chart, lab work & pertinent test results  Airway Mallampati: I  TM Distance: >3 FB Neck ROM: Full    Dental  (+) Teeth Intact, Dental Advisory Given   Pulmonary asthma , sleep apnea and Continuous Positive Airway Pressure Ventilation ,  breath sounds clear to auscultation        Cardiovascular Rhythm:Regular Rate:Normal     Neuro/Psych    GI/Hepatic GERD-  Medicated and Controlled,  Endo/Other    Renal/GU      Musculoskeletal   Abdominal   Peds  Hematology   Anesthesia Other Findings   Reproductive/Obstetrics                            Anesthesia Physical Anesthesia Plan  ASA: II  Anesthesia Plan: General   Post-op Pain Management:    Induction: Intravenous  Airway Management Planned: LMA  Additional Equipment:   Intra-op Plan:   Post-operative Plan: Extubation in OR  Informed Consent: I have reviewed the patients History and Physical, chart, labs and discussed the procedure including the risks, benefits and alternatives for the proposed anesthesia with the patient or authorized representative who has indicated his/her understanding and acceptance.   Dental advisory given  Plan Discussed with: CRNA, Surgeon and Anesthesiologist  Anesthesia Plan Comments:         Anesthesia Quick Evaluation

## 2015-01-16 NOTE — Anesthesia Postprocedure Evaluation (Signed)
  Anesthesia Post-op Note  Patient: [name]  Procedure(s) Performed: Procedure(s): LEFT KNEE SCOPE MEDIAL MENISECTOMY (Left) CHONDROPLASTY (Left)  Patient Location: PACU  Anesthesia Type: General   Level of Consciousness: awake, alert  and oriented  Airway and Oxygen Therapy: Patient Spontanous Breathing  Post-op Pain: mild  Post-op Assessment: Post-op Vital signs reviewed  Post-op Vital Signs: Reviewed  Last Vitals:  Filed Vitals:   01/16/15 1345  BP: 116/71  Pulse: 89  Temp:   Resp: 18    Complications: No apparent anesthesia complications

## 2015-01-16 NOTE — Discharge Instructions (Signed)
°  Post Anesthesia Home Care Instructions ° °Activity: °Get plenty of rest for the remainder of the day. A responsible adult should stay with you for 24 hours following the procedure.  °For the next 24 hours, DO NOT: °-Drive a car °-Operate machinery °-Drink alcoholic beverages °-Take any medication unless instructed by your physician °-Make any legal decisions or sign important papers. ° °Meals: °Start with liquid foods such as gelatin or soup. Progress to regular foods as tolerated. Avoid greasy, spicy, heavy foods. If nausea and/or vomiting occur, drink only clear liquids until the nausea and/or vomiting subsides. Call your physician if vomiting continues. ° °Special Instructions/Symptoms: °Your throat may feel dry or sore from the anesthesia or the breathing tube placed in your throat during surgery. If this causes discomfort, gargle with warm salt water. The discomfort should disappear within 24 hours. ° °Regional Anesthesia Blocks ° °1. Numbness or the inability to move the "blocked" extremity may last from 3-48 hours after placement. The length of time depends on the medication injected and your individual response to the medication. If the numbness is not going away after 48 hours, call your surgeon. ° °2. The extremity that is blocked will need to be protected until the numbness is gone and the  Strength has returned. Because you cannot feel it, you will need to take extra care to avoid injury. Because it may be weak, you may have difficulty moving it or using it. You may not know what position it is in without looking at it while the block is in effect. ° °3. For blocks in the legs and feet, returning to weight bearing and walking needs to be done carefully. You will need to wait until the numbness is entirely gone and the strength has returned. You should be able to move your leg and foot normally before you try and bear weight or walk. You will need someone to be with you when you first try to ensure you  do not fall and possibly risk injury. ° °4. Bruising and tenderness at the needle site are common side effects and will resolve in a few days. ° °5. Persistent numbness or new problems with movement should be communicated to the surgeon or the [facility] ([phone])/ [facility] ([phone]). °

## 2015-01-16 NOTE — Transfer of Care (Signed)
Immediate Anesthesia Transfer of Care Note  Patient: [name]  Procedure(s) Performed: Procedure(s): LEFT KNEE SCOPE MEDIAL MENISECTOMY (Left) CHONDROPLASTY (Left)  Patient Location: PACU  Anesthesia Type:GA combined with regional for post-op pain  Level of Consciousness: awake, alert  and oriented  Airway & Oxygen Therapy: Patient Spontanous Breathing and Patient connected to face mask oxygen  Post-op Assessment: Report given to RN, Post -op Vital signs reviewed and stable and Patient moving all extremities  Post vital signs: Reviewed and stable  Last Vitals:  Filed Vitals:   01/16/15 1115  BP: 110/79  Pulse: 72  Temp:   Resp:     Complications: No apparent anesthesia complications

## 2015-01-16 NOTE — Anesthesia Procedure Notes (Addendum)
Procedure Name: LMA Insertion Date/Time: 01/16/2015 12:15 PM Performed by: Baxter Flattery Pre-anesthesia Checklist: Patient identified, Emergency Drugs available, Suction available and Patient being monitored Patient Re-evaluated:Patient Re-evaluated prior to inductionOxygen Delivery Method: Circle System Utilized Preoxygenation: Pre-oxygenation with 100% oxygen Intubation Type: IV induction Ventilation: Mask ventilation without difficulty LMA: LMA inserted LMA Size: 4.0 Number of attempts: 1 Airway Equipment and Method: Bite block Placement Confirmation: positive ETCO2 and breath sounds checked- equal and bilateral Tube secured with: Tape Dental Injury: Teeth and Oropharynx as per pre-operative assessment     Left Knee joint injection:  At request of Dr. Noemi Chapel, I injected the Left knee joint at the two inferior ports.  Sterile prep with Chlorhexidine, 25 and 18g needle, 20 ml of 0.5% Marcaine. Sedated, tolerated well. Performed at 1110.

## 2015-01-16 NOTE — Progress Notes (Signed)
Assisted Dr. Crews with left, knee block. Side rails up, monitors on throughout procedure. See vital signs in flow sheet. Tolerated Procedure well. 

## 2015-01-17 ENCOUNTER — Encounter (HOSPITAL_BASED_OUTPATIENT_CLINIC_OR_DEPARTMENT_OTHER): Payer: Self-pay | Admitting: Orthopedic Surgery

## 2015-01-17 NOTE — Addendum Note (Signed)
Addendum  created 01/17/15 0735 by Tawni Millers, CRNA   Modules edited: Charges VN

## 2015-01-17 NOTE — Op Note (Signed)
NAME:  [name], [name]               ACCOUNT NO.:  [id number]  MEDICAL RECORD NO.:  [id number]  LOCATION:                                 FACILITY:  PHYSICIAN:  Audree Camel. Noemi Chapel, M.D. DATE OF BIRTH:  Apr 12, 1956  DATE OF PROCEDURE:  01/16/2015 DATE OF DISCHARGE:  01/16/2015                              OPERATIVE REPORT   PREOPERATIVE DIAGNOSES: 1. Left knee acute nontraumatic medial meniscus tear. 2. 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  POSTOPERATIVE DIAGNOSES: 1. Left knee acute nontraumatic medial meniscus tear. 2. 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  PROCEDURE: 1. Left knee examination under anesthesia followed by arthroscopic     partial medial meniscectomy. 2. Left knee medial compartment and patellofemoral chondroplasty.  SURGEON:  Audree Camel. Noemi Chapel, M.D.  ASSISTANT:  Matthew Saras, PA-C  ANESTHESIA:  General.  OPERATIVE TIME:  45 minutes.  COMPLICATIONS:  None.  INDICATION FOR PROCEDURE:  [name] is a 59 year old woman who has had 3-4 months of increasing left knee pain with exam and MRI documenting medial meniscus tear with chondromalacia.  She has failed conservative care and is now to undergo arthroscopy.  DESCRIPTION:  [name] was brought to the operating room on January 16, 2015, after knee block was placed in the holding room by Anesthesia. She was placed on the operating table in a supine position.  She received antibiotics preoperatively for prophylaxis.  After being placed under general anesthesia, her left knee was examined.  She had full range of motion.  Knee was stable to ligamentous exam with normal patellar tracking.  Left leg was prepped using sterile DuraPrep and draped using sterile technique.  Time-out procedure was called and the correct left knee identified.  Initially, through an anterolateral portal, the arthroscope with a pump  attached was placed into an anteromedial portal, an arthroscopic probe was placed.  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  Medial meniscus showed tearing of the posterior medial horn of which 70% was resected back to a stable, but degenerative rim.  Intercondylar notch inspected.  Anterior cruciate showed 3 mm of anterior laxity with an endpoint.  Posterior cruciate was intact and stable.  Lateral compartment inspected.  The articular cartilage showed 30% to 40% grade 3 chondromalacia on lateral tibial plateau, which was debrided.  Lateral femoral condyle was intact. Lateral meniscus was intact.  Patellofemoral joint showed 20% to 30% grade 4 and 50% grade 3 chondromalacia on the patella, which was debrided.  50% grade 3 changes on the femoral groove, which was debrided.  The patella tracked normally.  Medial and lateral gutters were free of pathology.  After this was done, it was felt that all pathology have been satisfactorily addressed.  The instruments were removed.  Portals were closed with 3-0 nylon suture.  Sterile dressings were applied.  The patient was awakened and taken to the recovery room in a stable condition.  FOLLOWUP CARE:  [name] to be followed as an outpatient on Norco for pain.  Seen back in the office in a week  for sutures out and followup.     Maricus Tanzi A. Noemi Chapel, M.D.     RAW/MEDQ  D:  01/16/2015  T:  01/17/2015  Job:  [id number]

## 2015-01-31 ENCOUNTER — Ambulatory Visit (HOSPITAL_COMMUNITY): Payer: 59 | Attending: Cardiovascular Disease | Admitting: Cardiology

## 2015-01-31 ENCOUNTER — Other Ambulatory Visit (HOSPITAL_COMMUNITY): Payer: Self-pay | Admitting: *Deleted

## 2015-01-31 DIAGNOSIS — M79605 Pain in left leg: Secondary | ICD-10-CM

## 2015-01-31 DIAGNOSIS — M7989 Other specified soft tissue disorders: Secondary | ICD-10-CM | POA: Insufficient documentation

## 2015-01-31 NOTE — Progress Notes (Signed)
Left lower extremity venous duplex performed  

## 2015-02-13 ENCOUNTER — Encounter
Admit: 2015-02-13 | Disposition: A | Discharge status: Home / Self Care | Payer: Self-pay | Attending: Orthopedic Surgery | Admitting: Orthopedic Surgery

## 2015-03-02 ENCOUNTER — Encounter
Admit: 2015-03-02 | Disposition: A | Discharge status: Home / Self Care | Payer: Self-pay | Attending: Orthopedic Surgery | Admitting: Orthopedic Surgery

## 2015-03-13 ENCOUNTER — Encounter: Payer: Self-pay | Admitting: Internal Medicine

## 2015-06-15 ENCOUNTER — Encounter: Payer: Self-pay | Admitting: Internal Medicine

## 2015-06-15 ENCOUNTER — Ambulatory Visit (INDEPENDENT_AMBULATORY_CARE_PROVIDER_SITE_OTHER): Payer: 59 | Admitting: Internal Medicine

## 2015-06-15 VITALS — BP 118/70 | HR 86 | Temp 98.4°F | Ht 67.33 in | Wt 211.0 lb

## 2015-06-15 DIAGNOSIS — J4530 Mild persistent asthma, uncomplicated: Secondary | ICD-10-CM

## 2015-06-15 DIAGNOSIS — F32A Depression, unspecified: Secondary | ICD-10-CM

## 2015-06-15 DIAGNOSIS — G4733 Obstructive sleep apnea (adult) (pediatric): Secondary | ICD-10-CM

## 2015-06-15 DIAGNOSIS — F418 Other specified anxiety disorders: Secondary | ICD-10-CM

## 2015-06-15 DIAGNOSIS — R1012 Left upper quadrant pain: Secondary | ICD-10-CM

## 2015-06-15 DIAGNOSIS — F419 Anxiety disorder, unspecified: Secondary | ICD-10-CM

## 2015-06-15 DIAGNOSIS — F329 Major depressive disorder, single episode, unspecified: Secondary | ICD-10-CM

## 2015-06-15 DIAGNOSIS — M129 Arthropathy, unspecified: Secondary | ICD-10-CM

## 2015-06-15 DIAGNOSIS — K219 Gastro-esophageal reflux disease without esophagitis: Secondary | ICD-10-CM

## 2015-06-15 DIAGNOSIS — N943 Premenstrual tension syndrome: Secondary | ICD-10-CM

## 2015-06-15 DIAGNOSIS — J302 Other seasonal allergic rhinitis: Secondary | ICD-10-CM

## 2015-06-15 DIAGNOSIS — M1710 Unilateral primary osteoarthritis, unspecified knee: Secondary | ICD-10-CM | POA: Insufficient documentation

## 2015-06-15 DIAGNOSIS — G43839 Menstrual migraine, intractable, without status migrainosus: Secondary | ICD-10-CM

## 2015-06-15 LAB — CBC
HEMATOCRIT: 36.7 % (ref 36.0–46.0)
Hemoglobin: 12.5 g/dL (ref 12.0–15.0)
MCH: 28.8 pg (ref 26.0–34.0)
MCHC: 34.1 g/dL (ref 30.0–36.0)
MCV: 84.6 fL (ref 78.0–100.0)
MPV: 8.7 fL (ref 8.6–12.4)
PLATELETS: 295 10*3/uL (ref 150–400)
RBC: 4.34 MIL/uL (ref 3.87–5.11)
RDW: 14 % (ref 11.5–15.5)
WBC: 7.8 10*3/uL (ref 4.0–10.5)

## 2015-06-15 LAB — COMPREHENSIVE METABOLIC PANEL
ALK PHOS: 87 U/L (ref 39–117)
ALT: 25 U/L (ref 0–35)
AST: 17 U/L (ref 0–37)
Albumin: 3.7 g/dL (ref 3.5–5.2)
BUN: 18 mg/dL (ref 6–23)
CHLORIDE: 108 meq/L (ref 96–112)
CO2: 23 mEq/L (ref 19–32)
Calcium: 9 mg/dL (ref 8.4–10.5)
Creat: 0.93 mg/dL (ref 0.50–1.10)
Glucose, Bld: 84 mg/dL (ref 70–99)
Potassium: 3.9 mEq/L (ref 3.5–5.3)
SODIUM: 141 meq/L (ref 135–145)
TOTAL PROTEIN: 5.9 g/dL — AB (ref 6.0–8.3)
Total Bilirubin: 0.3 mg/dL (ref 0.2–1.2)

## 2015-06-15 LAB — AMYLASE: AMYLASE: 38 U/L (ref 0–105)

## 2015-06-15 LAB — LIPASE: LIPASE: 16 U/L (ref 0–75)

## 2015-06-15 MED ORDER — FLUTICASONE-SALMETEROL 100-50 MCG/DOSE IN AEPB: 1.0000 | INHALATION_SPRAY | Freq: Two times a day (BID) | RESPIRATORY_TRACT | Status: DC

## 2015-06-15 MED ORDER — CELECOXIB 100 MG PO CAPS: 100.0000 mg | ORAL_CAPSULE | Freq: Two times a day (BID) | ORAL | Status: DC

## 2015-06-15 MED ORDER — OMEPRAZOLE 20 MG PO CPDR: 20.0000 mg | DELAYED_RELEASE_CAPSULE | Freq: Every day | ORAL | Status: DC

## 2015-06-15 MED ORDER — RIZATRIPTAN BENZOATE 10 MG PO TABS: 10.0000 mg | ORAL_TABLET | ORAL | Status: DC | PRN

## 2015-06-15 MED ORDER — TOPIRAMATE 100 MG PO TABS: 200.0000 mg | ORAL_TABLET | Freq: Every day | ORAL | Status: DC

## 2015-06-15 NOTE — Assessment & Plan Note (Signed)
Secondary to allergies Continue Advair Proair prn She no longer wants to follow with pulm for this and request that I start managing her asthma

## 2015-06-15 NOTE — Progress Notes (Signed)
HPI  Pt presents to the clinic today to establish care and for management of the conditions listed below. She is transferring care from [facility].  Flu: 08/2014 Tetanus: 03/2014 at [facility] employee health Pneumovax: never Prevnar: never Pap [facility]: [facility] Mammogram: 2014 Colon Screening: 2010 Vision Screening: as needed Dentist: as needed  Seasonal allergies: Usually worse in the spring and the fall. Controlled on Allegra.  Asthma: Moderate persistent, triggered by her allergies. She uses Advair daily. She rarely uses her Proair. She last had spirometry in 2011.  Sleep Apnea: She wears her CPAP nightly. She follows with Dr. Annamaria Boots at [facility] pulmonary care.  GERD: Caused by GI dysmotility. She did have workup for this in [place].  She denies breakthrough symptoms on Prilosec.  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  She is post menopausal. They are located in her forehead. She has no sensitivity to light or sound. She denies nausea or vomiting. She denies dizziness or blurred vision. She takes Topamax for prophylaxis and Maxalt for abortive therapy.  Mood disorder: Mostly anxiety and depression. She has seen Behavioral Health in the past. She is currently taking Pristiq, Abilify and Klonopin. She denies SI/HI. She does see her psychiatrist every 3 months.  Arthritis: She reports it runs in her family. Worse in her knees, ankles and elbow. She takes Meloxicam daily but does not feel like it is helping. She wants to know if there is any alternative medication that she can take.  She c/o of some tenderness in her LUQ. She reports this started a few months ago.  She can only notice it when she bends over. Eating does not affect it. Her bowels or moving normally. She has had  nausea budenies vomiting. She has not tried anything OTC.  Past Medical History  Diagnosis Date  . Allergic rhinitis   . Urticaria 1999  . Angioedema 1999  . Asthma   . Sleep apnea     uses CPAP  nightly  . Anxiety   . Depression   . GERD (gastroesophageal reflux disease)   . Arthritis   . Headache     migraines  . Acute medial meniscus tear of left knee     Current Outpatient Prescriptions  Medication Sig Dispense Refill  . Albuterol Sulfate (PROAIR RESPICLICK) 124 (90 BASE) MCG/ACT AEPB Inhale 2 puffs into the lungs 4 (four) times daily as needed. 1 each prn  . ARIPiprazole (ABILIFY) 2 MG tablet Take 2 mg by mouth daily.    . clonazePAM (KLONOPIN) 1 MG tablet Take 1 mg by mouth 2 (two) times daily.    Marland Kitchen desvenlafaxine (PRISTIQ) 100 MG 24 hr tablet Take 100 mg by mouth daily.    . fexofenadine (ALLEGRA) 180 MG tablet Take 180 mg by mouth daily.      Marland Kitchen HYDROcodone-acetaminophen (NORCO) 5-325 MG per tablet 1-2 tablets every 4-6 hrs as needed for pain 40 tablet 0  . meloxicam (MOBIC) 15 MG tablet Take 15 mg by mouth daily.    Marland Kitchen omeprazole (PRILOSEC) 20 MG capsule Take 20 mg by mouth daily.    . rizatriptan (MAXALT) 10 MG tablet Take 1 by mouth 1-2 times a week migraine onset     . topiramate (TOPAMAX) 100 MG tablet Take 200 mg by mouth at bedtime.     . Fluticasone-Salmeterol (ADVAIR DISKUS) 100-50 MCG/DOSE AEPB Inhale 1 puff into the lungs 2 (two) times daily. Rinse mouth 3 each 3   No current facility-administered medications for this visit.  No Known Allergies  Family History  Problem Relation Age of Onset  . COPD Father   . Arthritis Father   . Emphysema Father   . Sleep apnea Father   . Heart disease Mother   . COPD      sibling   . Arthritis      sibling    History   Social History  . Marital Status: Married    Spouse Name: N/A  . Number of Children: N/A  . Years of Education: N/A   Occupational History  . RN    Social History Main Topics  . Smoking status: Never Smoker   . Smokeless tobacco: Never Used  . Alcohol Use: 1.2 oz/week    2 Glasses of wine per week     Comment: social  . Drug Use: No  . Sexual Activity: Yes    Birth Control/  Protection: Surgical   Other Topics Concern  . Not on file   Social History Narrative   Positive history of passive tobacco smoke exposure.          ROS:  Constitutional: Denies fever, malaise, fatigue, headache or abrupt weight changes.  HEENT: Denies eye pain, eye redness, ear pain, ringing in the ears, wax buildup, runny nose, nasal congestion, bloody nose, or sore throat. Respiratory: Denies difficulty breathing, shortness of breath, cough or sputum production.   Cardiovascular: Denies chest pain, chest tightness, palpitations or swelling in the hands or feet.  Gastrointestinal: Pt reports nausea. Denies abdominal pain, bloating, constipation, diarrhea or blood in the stool.  GU: Denies frequency, urgency, pain with urination, blood in urine, odor or discharge. Musculoskeletal: Denies decrease in range of motion, difficulty with gait, muscle pain or joint pain and swelling.  Skin: Denies redness, rashes, lesions or ulcercations.  Neurological: Denies dizziness, difficulty with memory, difficulty with speech or problems with balance and coordination.  Psych: Pt reports history of anxiety and depression. Denies SI/HI.  No other specific complaints in a complete review of systems (except as listed in HPI above).  PE:  BP 118/70 mmHg  Pulse 86  Temp(Src) 98.4 F (36.9 C) (Oral)  Ht 5' 7.33" (1.71 m)  Wt 211 lb (95.709 kg)  BMI 32.73 kg/m2  SpO2 98%  LMP 12/17/2011  Wt Readings from Last 3 Encounters:  01/10/15 205 lb (92.987 kg)  12/28/14 207 lb 6.4 oz (94.076 kg)  09/21/14 201 lb (91.173 kg)    General: Appears her stated age, well developed, well nourished in NAD. Skin: Warm, dry and intacts. HEENT: Head: normal shape and size; Eyes: sclera white, no icterus, conjunctiva pink, PERRLA and EOMs intact; Ears: Tm's gray and intact, normal light reflex; Nose: mucosa pink and moist, septum midline; Throat/Mouth: Teeth present, mucosa pink and moist, no lesions or ulcerations  noted.  Neck: Neck supple, trachea midline. No masses, lumps or thyromegaly present.  Cardiovascular: Normal rate and rhythm. S1,S2 noted.  No murmur, rubs or gallops noted.  Pulmonary/Chest: Normal effort and positive vesicular breath sounds. No respiratory distress. No wheezes, rales or ronchi noted.  Abdomen: Soft and tender in the LUQ. Normal bowel sounds, no bruits noted. No distention or masses noted. Liver, spleen and kidneys non palpable. MSK: No joint effusions noted. Neurological: Alert and oriented.  Psychiatric: Mood and affect normal. Behavior is normal. Judgment and thought content normal.     BMET    Component Value Date/Time   NA 137 11/26/2010 1351   K 3.8 11/26/2010 1351   CL 109 11/26/2010  1351   CO2 21 11/26/2010 1351   GLUCOSE 90 11/26/2010 1351   BUN 12 11/26/2010 1351   CREATININE 0.75 11/26/2010 1351   CALCIUM 9.2 11/26/2010 1351   GFRNONAA >60 11/26/2010 1351   GFRAA  11/26/2010 1351    >60        The eGFR has been calculated using the MDRD equation. This calculation has not been validated in all clinical situations. eGFR's persistently <60 mL/min signify possible Chronic Kidney Disease.    Lipid Panel  No results found for: CHOL, TRIG, HDL, CHOLHDL, VLDL, LDLCALC  CBC    Component Value Date/Time   WBC 8.1 10/27/2011 0947   RBC 4.01 10/27/2011 0947   HGB 13.6 01/16/2015 1046   HCT 35.9* 10/27/2011 0947   PLT 299 10/27/2011 0947   MCV 89.5 10/27/2011 0947   MCH 28.7 10/27/2011 0947   MCHC 32.0 10/27/2011 0947   RDW 13.9 10/27/2011 0947   LYMPHSABS 1.9 10/27/2011 0947   MONOABS 0.6 10/27/2011 0947   EOSABS 0.2 10/27/2011 0947   BASOSABS 0.0 10/27/2011 0947    Hgb A1C No results found for: HGBA1C   Assessment and Plan:  LUQ pain:  Will check lipase, amylase, CMET and H Pylori  RTC in 6 months to follow up chronic conditions

## 2015-06-15 NOTE — Assessment & Plan Note (Signed)
Continue CPAP at night She will follow with Dr. Annamaria Boots prn for this

## 2015-06-15 NOTE — Assessment & Plan Note (Signed)
Not good control with Meloxicam Switch to Celebrex, eRx sent CMET today to assess renal function

## 2015-06-15 NOTE — Progress Notes (Signed)
Pre visit review using our clinic review tool, if applicable. No additional management support is needed unless otherwise documented below in the visit note. 

## 2015-06-15 NOTE — Assessment & Plan Note (Signed)
She will continue to follow with psych Continue current medications at this  time

## 2015-06-15 NOTE — Assessment & Plan Note (Signed)
Advised her to continue Allegra

## 2015-06-15 NOTE — Patient Instructions (Signed)

## 2015-06-15 NOTE — Assessment & Plan Note (Signed)
She will continue to take Prilosec CMET today

## 2015-06-15 NOTE — Assessment & Plan Note (Signed)
Controlled with Topamax and Maxalt

## 2015-06-18 NOTE — Addendum Note (Signed)
Addended by: Ellamae Sia on: 06/18/2015 08:31 AM   Modules accepted: Orders

## 2015-06-19 LAB — HELICOBACTER PYLORI  SPECIAL ANTIGEN: H. PYLORI Antigen: NEGATIVE

## 2015-09-14 ENCOUNTER — Encounter: Payer: Self-pay | Admitting: Internal Medicine

## 2015-09-14 ENCOUNTER — Ambulatory Visit (INDEPENDENT_AMBULATORY_CARE_PROVIDER_SITE_OTHER): Payer: 59 | Admitting: Internal Medicine

## 2015-09-14 VITALS — BP 114/78 | HR 78 | Temp 98.0°F | Wt 202.0 lb

## 2015-09-14 DIAGNOSIS — K644 Residual hemorrhoidal skin tags: Secondary | ICD-10-CM

## 2015-09-14 DIAGNOSIS — R21 Rash and other nonspecific skin eruption: Secondary | ICD-10-CM | POA: Diagnosis not present

## 2015-09-14 DIAGNOSIS — K648 Other hemorrhoids: Secondary | ICD-10-CM

## 2015-09-14 MED ORDER — TRIAMCINOLONE ACETONIDE 0.5 % EX OINT: 1.0000 "application " | TOPICAL_OINTMENT | Freq: Two times a day (BID) | CUTANEOUS | Status: DC

## 2015-09-14 MED ORDER — HYDROCORTISONE 2.5 % RE CREA: 1.0000 "application " | TOPICAL_CREAM | Freq: Two times a day (BID) | RECTAL | Status: DC

## 2015-09-14 NOTE — Progress Notes (Signed)
Subjective:    Patient ID: [name], female    DOB: Jun 20, 1956, 59 y.o.   MRN: [id number]  HPI  Pt presents to the clinic today with c/o a rash on her right buttock. She noticed this 3 weeks ago. The rash is very itchy. She denies any recent bug or tick bites. She denies changes in soaps, lotions or detergents. She has tried OTC steroid cream without much relief.  She also has a history of external hemorrhoids. She would like a refill on her anusol cream.  Review of Systems      Past Medical History  Diagnosis Date  . Allergic rhinitis   . Urticaria 1999  . Angioedema 1999  . Asthma   . Sleep apnea     uses CPAP nightly  . Anxiety   . Depression   . GERD (gastroesophageal reflux disease)   . Arthritis   . Headache     migraines  . Acute medial meniscus tear of left knee     Current Outpatient Prescriptions  Medication Sig Dispense Refill  . Albuterol Sulfate (PROAIR RESPICLICK) 108 (90 BASE) MCG/ACT AEPB Inhale 2 puffs into the lungs 4 (four) times daily as needed. 1 each prn  . ARIPiprazole (ABILIFY) 2 MG tablet Take 2 mg by mouth daily.    . celecoxib (CELEBREX) 100 MG capsule Take 1 capsule (100 mg total) by mouth 2 (two) times daily. 180 capsule 1  . clonazePAM (KLONOPIN) 1 MG tablet Take 1 mg by mouth 2 (two) times daily.    Marland Kitchen desvenlafaxine (PRISTIQ) 100 MG 24 hr tablet Take 100 mg by mouth daily.    . fexofenadine (ALLEGRA) 180 MG tablet Take 180 mg by mouth daily.      . Fluticasone-Salmeterol (ADVAIR DISKUS) 100-50 MCG/DOSE AEPB Inhale 1 puff into the lungs 2 (two) times daily. Rinse mouth 3 each 3  . omeprazole (PRILOSEC) 20 MG capsule Take 1 capsule (20 mg total) by mouth daily. 90 capsule 1  . rizatriptan (MAXALT) 10 MG tablet Take 1 tablet (10 mg total) by mouth as needed for migraine. Take 1 by mouth 1-2 times a week migraine onset 30 tablet 1  . topiramate (TOPAMAX) 100 MG tablet Take 2 tablets (200 mg total) by mouth at bedtime. 180 tablet 1   No  current facility-administered medications for this visit.    No Known Allergies  Family History  Problem Relation Age of Onset  . COPD Father   . Arthritis Father   . Emphysema Father   . Sleep apnea Father   . Heart disease Mother   . COPD      sibling   . Arthritis      sibling    Social History   Social History  . Marital Status: Married    Spouse Name: N/A  . Number of Children: N/A  . Years of Education: N/A   Occupational History  . RN    Social History Main Topics  . Smoking status: Never Smoker   . Smokeless tobacco: Never Used  . Alcohol Use: 1.2 oz/week    2 Glasses of wine per week     Comment: social  . Drug Use: No  . Sexual Activity: Yes    Birth Control/ Protection: Surgical, Post-menopausal   Other Topics Concern  . Not on file   Social History Narrative   Positive history of passive tobacco smoke exposure.           Constitutional: Denies fever, malaise,  fatigue, headache or abrupt weight changes.  Respiratory: Denies difficulty breathing, shortness of breath, cough or sputum production.   Cardiovascular: Denies chest pain, chest tightness, palpitations or swelling in the hands or feet.  Gastrointestinal: Pt reports hemorrhoids. Denies abdominal pain, bloating, constipation, diarrhea or blood in the stool.  GU: Denies urgency, frequency, pain with urination, burning sensation, blood in urine, odor or discharge. Skin: Pt reports rash. Denies lesions or ulcercations.    No other specific complaints in a complete review of systems (except as listed in HPI above).  Objective:   Physical Exam   BP 114/78 mmHg  Pulse 78  Temp(Src) 98 F (36.7 C) (Oral)  Wt 202 lb (91.627 kg)  SpO2 98%  LMP 12/17/2011 Wt Readings from Last 3 Encounters:  09/14/15 202 lb (91.627 kg)  06/15/15 211 lb (95.709 kg)  01/10/15 205 lb (92.987 kg)    General: Appears her stated age, obese in NAD. Skin: Quarter size skin lesion with 2 central ulcerations  noted on right buttocks. Ecxoriation noted. Cardiovascular: Normal rate and rhythm. S1,S2 noted.  No murmur, rubs or gallops noted.  Pulmonary/Chest: Normal effort and positive vesicular breath sounds. No respiratory distress. No wheezes, rales or ronchi noted.   BMET    Component Value Date/Time   NA 141 06/15/2015 1620   K 3.9 06/15/2015 1620   CL 108 06/15/2015 1620   CO2 23 06/15/2015 1620   GLUCOSE 84 06/15/2015 1620   BUN 18 06/15/2015 1620   CREATININE 0.93 06/15/2015 1620   CREATININE 0.75 11/26/2010 1351   CALCIUM 9.0 06/15/2015 1620   GFRNONAA >60 11/26/2010 1351   GFRAA  11/26/2010 1351    >60        The eGFR has been calculated using the MDRD equation. This calculation has not been validated in all clinical situations. eGFR's persistently <60 mL/min signify possible Chronic Kidney Disease.    Lipid Panel  No results found for: CHOL, TRIG, HDL, CHOLHDL, VLDL, LDLCALC  CBC    Component Value Date/Time   WBC 7.8 06/15/2015 1620   RBC 4.34 06/15/2015 1620   HGB 12.5 06/15/2015 1620   HCT 36.7 06/15/2015 1620   PLT 295 06/15/2015 1620   MCV 84.6 06/15/2015 1620   MCH 28.8 06/15/2015 1620   MCHC 34.1 06/15/2015 1620   RDW 14.0 06/15/2015 1620   LYMPHSABS 1.9 10/27/2011 0947   MONOABS 0.6 10/27/2011 0947   EOSABS 0.2 10/27/2011 0947   BASOSABS 0.0 10/27/2011 0947    Hgb A1C No results found for: HGBA1C      Assessment & Plan:   Rash of Buttocks:  Appears to be healing eRx for Triamcinolone cream to affected area Avoid scratching  External hemorrhoids:  Anusol HC refilled  RTC as needed or if symptoms persist or worsen

## 2015-09-14 NOTE — Patient Instructions (Signed)

## 2015-09-14 NOTE — Progress Notes (Signed)
Pre visit review using our clinic review tool, if applicable. No additional management support is needed unless otherwise documented below in the visit note. 

## 2015-09-27 ENCOUNTER — Encounter: Payer: Self-pay | Admitting: Internal Medicine

## 2015-09-27 ENCOUNTER — Ambulatory Visit (INDEPENDENT_AMBULATORY_CARE_PROVIDER_SITE_OTHER): Payer: 59 | Admitting: Internal Medicine

## 2015-09-27 VITALS — BP 118/74 | HR 78 | Temp 98.1°F | Ht 67.33 in | Wt 201.5 lb

## 2015-09-27 DIAGNOSIS — Z0000 Encounter for general adult medical examination without abnormal findings: Secondary | ICD-10-CM

## 2015-09-27 DIAGNOSIS — J4530 Mild persistent asthma, uncomplicated: Secondary | ICD-10-CM

## 2015-09-27 LAB — LIPID PANEL
CHOL/HDL RATIO: 3
CHOLESTEROL: 177 mg/dL (ref 0–200)
HDL: 52.4 mg/dL (ref >39.00)
LDL Cholesterol: 90 mg/dL (ref 0–99)
NonHDL: 124.34
TRIGLYCERIDES: 171 mg/dL — AB (ref 0.0–149.0)
VLDL: 34.2 mg/dL (ref 0.0–40.0)

## 2015-09-27 LAB — HEMOGLOBIN A1C: Hgb A1c MFr Bld: 5.6 % (ref 4.6–6.5)

## 2015-09-27 LAB — TSH: TSH: 0.49 u[IU]/mL (ref 0.35–4.50)

## 2015-09-27 MED ORDER — FLUTICASONE-SALMETEROL 100-50 MCG/DOSE IN AEPB: 1.0000 | INHALATION_SPRAY | Freq: Two times a day (BID) | RESPIRATORY_TRACT | Status: DC

## 2015-09-27 NOTE — Progress Notes (Signed)
Subjective:    Patient ID: [name], female    DOB: 11-20-1956, 59 y.o.   MRN: [id number]  HPI  Pt presents to the clinic today for her annual exam.   Flu: 08/2015 Tetanus: 2015 Pap Smear: 2015, at [facility], normal Mammogram: 2015, at [place], will schedule Colon Screening: 2010 (10 years) Vision Screening: as needed Dentist: as needed  Diet: She eats fruits and veggies daily. She does eat meat. She is on weight watchers. She drinks mostly water. Exercise: She tries to walk but does admit to being inactive.   She does request a refill of her Advair today. Review of Systems      Past Medical History  Diagnosis Date  . Allergic rhinitis   . Urticaria 1999  . Angioedema 1999  . Asthma   . Sleep apnea     uses CPAP nightly  . Anxiety   . Depression   . GERD (gastroesophageal reflux disease)   . Arthritis   . Headache     migraines  . Acute medial meniscus tear of left knee     Current Outpatient Prescriptions  Medication Sig Dispense Refill  . Albuterol Sulfate (PROAIR RESPICLICK) 035 (90 BASE) MCG/ACT AEPB Inhale 2 puffs into the lungs 4 (four) times daily as needed. 1 each prn  . ARIPiprazole (ABILIFY) 2 MG tablet Take 2 mg by mouth daily.    . celecoxib (CELEBREX) 100 MG capsule Take 1 capsule (100 mg total) by mouth 2 (two) times daily. 180 capsule 1  . clonazePAM (KLONOPIN) 1 MG tablet Take 1 mg by mouth 2 (two) times daily.    Marland Kitchen desvenlafaxine (PRISTIQ) 100 MG 24 hr tablet Take 100 mg by mouth daily.    . fexofenadine (ALLEGRA) 180 MG tablet Take 180 mg by mouth daily.      . Fluticasone-Salmeterol (ADVAIR DISKUS) 100-50 MCG/DOSE AEPB Inhale 1 puff into the lungs 2 (two) times daily. Rinse mouth 3 each 3  . hydrocortisone (ANUSOL-HC) 2.5 % rectal cream Place 1 application rectally 2 (two) times daily. 30 g 0  . omeprazole (PRILOSEC) 20 MG capsule Take 1 capsule (20 mg total) by mouth daily. 90 capsule 1  . rizatriptan (MAXALT) 10 MG tablet  Take 1 tablet (10 mg total) by mouth as needed for migraine. Take 1 by mouth 1-2 times a week migraine onset 30 tablet 1  . topiramate (TOPAMAX) 100 MG tablet Take 2 tablets (200 mg total) by mouth at bedtime. 180 tablet 1  . triamcinolone ointment (KENALOG) 0.5 % Apply 1 application topically 2 (two) times daily. 30 g 0   No current facility-administered medications for this visit.    No Known Allergies  Family History  Problem Relation Age of Onset  . COPD Father   . Arthritis Father   . Emphysema Father   . Sleep apnea Father   . Heart disease Mother   . COPD      sibling   . Arthritis      sibling    Social History   Social History  . Marital Status: Married    Spouse Name: N/A  . Number of Children: N/A  . Years of Education: N/A   Occupational History  . RN    Social History Main Topics  . Smoking status: Never Smoker   . Smokeless tobacco: Never Used  . Alcohol Use: 1.2 oz/week    2 Glasses of wine per week     Comment: social  . Drug  Use: No  . Sexual Activity: Yes    Birth Control/ Protection: Surgical, Post-menopausal   Other Topics Concern  . Not on file   Social History Narrative   Positive history of passive tobacco smoke exposure.           Constitutional: Denies fever, malaise, fatigue, headache or abrupt weight changes.  HEENT: Denies eye pain, eye redness, ear pain, ringing in the ears, wax buildup, runny nose, nasal congestion, bloody nose, or sore throat. Respiratory: Denies difficulty breathing, shortness of breath, cough or sputum production.   Cardiovascular: Denies chest pain, chest tightness, palpitations or swelling in the hands or feet.  Gastrointestinal: Denies abdominal pain, bloating, constipation, diarrhea or blood in the stool.  GU: Denies urgency, frequency, pain with urination, burning sensation, blood in urine, odor or discharge. Musculoskeletal: Denies decrease in range of motion, difficulty with gait, muscle pain or joint  pain and swelling.  Skin: Denies redness, rashes, lesions or ulcercations.  Neurological: Denies dizziness, difficulty with memory, difficulty with speech or problems with balance and coordination.  Psych: Denies anxiety, depression, SI/HI.  No other specific complaints in a complete review of systems (except as listed in HPI above).  Objective:   Physical Exam   BP 118/74 mmHg  Pulse 78  Temp(Src) 98.1 F (36.7 C) (Oral)  Ht 5' 7.33" (1.71 m)  Wt 201 lb 8 oz (91.4 kg)  BMI 31.26 kg/m2  SpO2 97%  LMP 12/17/2011 Wt Readings from Last 3 Encounters:  09/27/15 201 lb 8 oz (91.4 kg)  09/14/15 202 lb (91.627 kg)  06/15/15 211 lb (95.709 kg)    General: Appears her stated age, well developed, well nourished in NAD. Skin: Warm, dry and intact. No rashes, lesions or ulcerations noted. HEENT: Head: normal shape and size; Eyes: sclera white, no icterus, conjunctiva pink, PERRLA and EOMs intact; Ears: Tm's gray and intact, normal light reflex;  Throat/Mouth: Teeth present, mucosa pink and moist, no exudate, lesions or ulcerations noted.  Neck:  Neck supple, trachea midline. No masses, lumps or thyromegaly present.  Cardiovascular: Normal rate and rhythm. S1,S2 noted.  No murmur, rubs or gallops noted.  Pulmonary/Chest: Normal effort and positive vesicular breath sounds. No respiratory distress. No wheezes, rales or ronchi noted.  Abdomen: Soft and nontender. Normal bowel sounds. No distention or masses noted. Liver, spleen and kidneys non palpable. Musculoskeletal: Strength 5/5 BUE/BLE. No signs of joint swelling. No difficulty with gait.  Neurological: Alert and oriented. Cranial nerves II-XII grossly intact. Coordination normal.  Psychiatric: Mood and affect normal. Behavior is normal. Judgment and thought content normal.     BMET    Component Value Date/Time   NA 141 06/15/2015 1620   K 3.9 06/15/2015 1620   CL 108 06/15/2015 1620   CO2 23 06/15/2015 1620   GLUCOSE 84 06/15/2015  1620   BUN 18 06/15/2015 1620   CREATININE 0.93 06/15/2015 1620   CREATININE 0.75 11/26/2010 1351   CALCIUM 9.0 06/15/2015 1620   GFRNONAA >60 11/26/2010 1351   GFRAA  11/26/2010 1351    >60        The eGFR has been calculated using the MDRD equation. This calculation has not been validated in all clinical situations. eGFR's persistently <60 mL/min signify possible Chronic Kidney Disease.    Lipid Panel  No results found for: CHOL, TRIG, HDL, CHOLHDL, VLDL, LDLCALC  CBC    Component Value Date/Time   WBC 7.8 06/15/2015 1620   RBC 4.34 06/15/2015 1620   HGB 12.5  06/15/2015 1620   HCT 36.7 06/15/2015 1620   PLT 295 06/15/2015 1620   MCV 84.6 06/15/2015 1620   MCH 28.8 06/15/2015 1620   MCHC 34.1 06/15/2015 1620   RDW 14.0 06/15/2015 1620   LYMPHSABS 1.9 10/27/2011 0947   MONOABS 0.6 10/27/2011 0947   EOSABS 0.2 10/27/2011 0947   BASOSABS 0.0 10/27/2011 0947    Hgb A1C No results found for: HGBA1C      Assessment & Plan:   Preventative Health Maintenance:  Flu and Tetanus UTD She will call to make appt for Mammogram at [facility] Pap Smear not due today Encouraged her to consume a balanced diet with fresh fruits and veggies and start an exercise program Advised her to see an eye doctor and dentist at least annually Will check Lipid, TSH and A1C today  RTC in 6 months or sooner if needed

## 2015-09-27 NOTE — Patient Instructions (Signed)
Health Maintenance, Female Adopting a healthy lifestyle and getting preventive care can go a long way to promote health and wellness. Talk with your health care provider about what schedule of regular examinations is right for you. This is a good chance for you to check in with your provider about disease prevention and staying healthy. In between checkups, there are plenty of things you can do on your own. Experts have done a lot of research about which lifestyle changes and preventive measures are most likely to keep you healthy. Ask your health care provider for more information. WEIGHT AND DIET  Eat a healthy diet  Be sure to include plenty of vegetables, fruits, low-fat dairy products, and lean protein.  Do not eat a lot of foods high in solid fats, added sugars, or salt.  Get regular exercise. This is one of the most important things you can do for your health.  Most adults should exercise for at least 150 minutes each week. The exercise should increase your heart rate and make you sweat (moderate-intensity exercise).  Most adults should also do strengthening exercises at least twice a week. This is in addition to the moderate-intensity exercise.  Maintain a healthy weight  Body mass index (BMI) is a measurement that can be used to identify possible weight problems. It estimates body fat based on height and weight. Your health care provider can help determine your BMI and help you achieve or maintain a healthy weight.  For females 20 years of age and older:   A BMI below 18.5 is considered underweight.  A BMI of 18.5 to 24.9 is normal.  A BMI of 25 to 29.9 is considered overweight.  A BMI of 30 and above is considered obese.  Watch levels of cholesterol and blood lipids  You should start having your blood tested for lipids and cholesterol at 57 years of age, then have this test every 5 years.  You may need to have your cholesterol levels checked more often if:  Your lipid  or cholesterol levels are high.  You are older than 57 years of age.  You are at high risk for heart disease.  CANCER SCREENING   Lung Cancer  Lung cancer screening is recommended for adults 55-80 years old who are at high risk for lung cancer because of a history of smoking.  A yearly low-dose CT scan of the lungs is recommended for people who:  Currently smoke.  Have quit within the past 15 years.  Have at least a 30-pack-year history of smoking. A pack year is smoking an average of one pack of cigarettes a day for 1 year.  Yearly screening should continue until it has been 15 years since you quit.  Yearly screening should stop if you develop a health problem that would prevent you from having lung cancer treatment.  Breast Cancer  Practice breast self-awareness. This means understanding how your breasts normally appear and feel.  It also means doing regular breast self-exams. Let your health care provider know about any changes, no matter how small.  If you are in your 20s or 30s, you should have a clinical breast exam (CBE) by a health care provider every 1-3 years as part of a regular health exam.  If you are 40 or older, have a CBE every year. Also consider having a breast X-ray (mammogram) every year.  If you have a family history of breast cancer, talk to your health care provider about genetic screening.  If you   are at high risk for breast cancer, talk to your health care provider about having an MRI and a mammogram every year.  Breast cancer gene (BRCA) assessment is recommended for women who have family members with BRCA-related cancers. BRCA-related cancers include:  Breast.  Ovarian.  Tubal.  Peritoneal cancers.  Results of the assessment will determine the need for genetic counseling and BRCA1 and BRCA2 testing. Cervical Cancer Your health care provider may recommend that you be screened regularly for cancer of the pelvic organs (ovaries, uterus, and  vagina). This screening involves a pelvic examination, including checking for microscopic changes to the surface of your cervix (Pap test). You may be encouraged to have this screening done every 3 years, beginning at age 21.  For women ages 30-65, health care providers may recommend pelvic exams and Pap testing every 3 years, or they may recommend the Pap and pelvic exam, combined with testing for human papilloma virus (HPV), every 5 years. Some types of HPV increase your risk of cervical cancer. Testing for HPV may also be done on women of any age with unclear Pap test results.  Other health care providers may not recommend any screening for nonpregnant women who are considered low risk for pelvic cancer and who do not have symptoms. Ask your health care provider if a screening pelvic exam is right for you.  If you have had past treatment for cervical cancer or a condition that could lead to cancer, you need Pap tests and screening for cancer for at least 20 years after your treatment. If Pap tests have been discontinued, your risk factors (such as having a new sexual partner) need to be reassessed to determine if screening should resume. Some women have medical problems that increase the chance of getting cervical cancer. In these cases, your health care provider may recommend more frequent screening and Pap tests. Colorectal Cancer  This type of cancer can be detected and often prevented.  Routine colorectal cancer screening usually begins at 57 years of age and continues through 57 years of age.  Your health care provider may recommend screening at an earlier age if you have risk factors for colon cancer.  Your health care provider may also recommend using home test kits to check for hidden blood in the stool.  A small camera at the end of a tube can be used to examine your colon directly (sigmoidoscopy or colonoscopy). This is done to check for the earliest forms of colorectal  cancer.  Routine screening usually begins at age 50.  Direct examination of the colon should be repeated every 5-10 years through 57 years of age. However, you may need to be screened more often if early forms of precancerous polyps or small growths are found. Skin Cancer  Check your skin from head to toe regularly.  Tell your health care provider about any new moles or changes in moles, especially if there is a change in a mole's shape or color.  Also tell your health care provider if you have a mole that is larger than the size of a pencil eraser.  Always use sunscreen. Apply sunscreen liberally and repeatedly throughout the day.  Protect yourself by wearing long sleeves, pants, a wide-brimmed hat, and sunglasses whenever you are outside. HEART DISEASE, DIABETES, AND HIGH BLOOD PRESSURE   High blood pressure causes heart disease and increases the risk of stroke. High blood pressure is more likely to develop in:  People who have blood pressure in the high end   of the normal range (130-139/85-89 mm Hg).  People who are overweight or obese.  People who are African American.  If you are 38-23 years of age, have your blood pressure checked every 3-5 years. If you are 61 years of age or older, have your blood pressure checked every year. You should have your blood pressure measured twice--once when you are at a hospital or clinic, and once when you are not at a hospital or clinic. Record the average of the two measurements. To check your blood pressure when you are not at a hospital or clinic, you can use:  An automated blood pressure machine at a pharmacy.  A home blood pressure monitor.  If you are between 45 years and 39 years old, ask your health care provider if you should take aspirin to prevent strokes.  Have regular diabetes screenings. This involves taking a blood sample to check your fasting blood sugar level.  If you are at a normal weight and have a low risk for diabetes,  have this test once every three years after 57 years of age.  If you are overweight and have a high risk for diabetes, consider being tested at a younger age or more often. PREVENTING INFECTION  Hepatitis B  If you have a higher risk for hepatitis B, you should be screened for this virus. You are considered at high risk for hepatitis B if:  You were born in a country where hepatitis B is common. Ask your health care provider which countries are considered high risk.  Your parents were born in a high-risk country, and you have not been immunized against hepatitis B (hepatitis B vaccine).  You have HIV or AIDS.  You use needles to inject street drugs.  You live with someone who has hepatitis B.  You have had sex with someone who has hepatitis B.  You get hemodialysis treatment.  You take certain medicines for conditions, including cancer, organ transplantation, and autoimmune conditions. Hepatitis C  Blood testing is recommended for:  Everyone born from [birth year] through 1963.  Anyone with known risk factors for hepatitis C. Sexually transmitted infections (STIs)  You should be screened for sexually transmitted infections (STIs) including gonorrhea and chlamydia if:  You are sexually active and are younger than 57 years of age.  You are older than 57 years of age and your health care provider tells you that you are at risk for this type of infection.  Your sexual activity has changed since you were last screened and you are at an increased risk for chlamydia or gonorrhea. Ask your health care provider if you are at risk.  If you do not have HIV, but are at risk, it may be recommended that you take a prescription medicine daily to prevent HIV infection. This is called pre-exposure prophylaxis (PrEP). You are considered at risk if:  You are sexually active and do not regularly use condoms or know the HIV status of your partner(s).  You take drugs by injection.  You are sexually  active with a partner who has HIV. Talk with your health care provider about whether you are at high risk of being infected with HIV. If you choose to begin PrEP, you should first be tested for HIV. You should then be tested every 3 months for as long as you are taking PrEP.  PREGNANCY   If you are premenopausal and you may become pregnant, ask your health care provider about preconception counseling.  If you may  become pregnant, take 400 to 800 micrograms (mcg) of folic acid every day.  If you want to prevent pregnancy, talk to your health care provider about birth control (contraception). OSTEOPOROSIS AND MENOPAUSE   Osteoporosis is a disease in which the bones lose minerals and strength with aging. This can result in serious bone fractures. Your risk for osteoporosis can be identified using a bone density scan.  If you are 61 years of age or older, or if you are at risk for osteoporosis and fractures, ask your health care provider if you should be screened.  Ask your health care provider whether you should take a calcium or vitamin D supplement to lower your risk for osteoporosis.  Menopause may have certain physical symptoms and risks.  Hormone replacement therapy may reduce some of these symptoms and risks. Talk to your health care provider about whether hormone replacement therapy is right for you.  HOME CARE INSTRUCTIONS   Schedule regular health, dental, and eye exams.  Stay current with your immunizations.   Do not use any tobacco products including cigarettes, chewing tobacco, or electronic cigarettes.  If you are pregnant, do not drink alcohol.  If you are breastfeeding, limit how much and how often you drink alcohol.  Limit alcohol intake to no more than 1 drink per day for nonpregnant women. One drink equals 12 ounces of beer, 5 ounces of wine, or 1 ounces of hard liquor.  Do not use street drugs.  Do not share needles.  Ask your health care provider for help if  you need support or information about quitting drugs.  Tell your health care provider if you often feel depressed.  Tell your health care provider if you have ever been abused or do not feel safe at home.   This information is not intended to replace advice given to you by your health care provider. Make sure you discuss any questions you have with your health care provider.   Document Released: 06/02/2011 Document Revised: 12/08/2014 Document Reviewed: 10/19/2013 Elsevier Interactive Patient Education [employer].

## 2015-09-27 NOTE — Progress Notes (Signed)
Pre visit review using our clinic review tool, if applicable. No additional management support is needed unless otherwise documented below in the visit note. 

## 2015-09-27 NOTE — Progress Notes (Signed)
Subjective:    Patient ID: [name], female    DOB: [birth year], 57 y.o.   MRN: [id number]  HPI [name] is 59 year old female who presents today for her annual exam.    Flu: 08/2015 TD: 2015 Pap: 2015 Mammogram: will schedule herself for this year Colonoscopy: 2010, 10 year follow up reccommended.  Vision: prn Dentis: prn    Review of Systems  Constitutional: Negative for fever, chills and fatigue.  HENT: Negative.   Respiratory: Negative for cough, shortness of breath and wheezing.   Cardiovascular: Negative for chest pain, palpitations and leg swelling.  Gastrointestinal: Negative for abdominal pain, diarrhea and constipation.  Genitourinary: Negative for dysuria, frequency and flank pain.  Musculoskeletal: Negative for joint swelling and arthralgias.  Neurological: Negative for dizziness, light-headedness and headaches.  Psychiatric/Behavioral: Negative for self-injury and agitation. The patient is not nervous/anxious.    Family History  Problem Relation Age of Onset  . COPD Father   . Arthritis Father   . Emphysema Father   . Sleep apnea Father   . Heart disease Mother   . COPD      sibling   . Arthritis      sibling   Current Outpatient Prescriptions on File Prior to Visit  Medication Sig Dispense Refill  . Albuterol Sulfate (PROAIR RESPICLICK) 628 (90 BASE) MCG/ACT AEPB Inhale 2 puffs into the lungs 4 (four) times daily as needed. 1 each prn  . ARIPiprazole (ABILIFY) 2 MG tablet Take 2 mg by mouth daily.    . celecoxib (CELEBREX) 100 MG capsule Take 1 capsule (100 mg total) by mouth 2 (two) times daily. 180 capsule 1  . clonazePAM (KLONOPIN) 1 MG tablet Take 1 mg by mouth 2 (two) times daily.    Marland Kitchen desvenlafaxine (PRISTIQ) 100 MG 24 hr tablet Take 100 mg by mouth daily.    . fexofenadine (ALLEGRA) 180 MG tablet Take 180 mg by mouth daily.      . hydrocortisone (ANUSOL-HC) 2.5 % rectal cream Place 1 application rectally 2 (two) times daily. 30 g 0  .  omeprazole (PRILOSEC) 20 MG capsule Take 1 capsule (20 mg total) by mouth daily. 90 capsule 1  . rizatriptan (MAXALT) 10 MG tablet Take 1 tablet (10 mg total) by mouth as needed for migraine. Take 1 by mouth 1-2 times a week migraine onset 30 tablet 1  . topiramate (TOPAMAX) 100 MG tablet Take 2 tablets (200 mg total) by mouth at bedtime. 180 tablet 1  . triamcinolone ointment (KENALOG) 0.5 % Apply 1 application topically 2 (two) times daily. 30 g 0   No current facility-administered medications on file prior to visit.        Objective:   Physical Exam  Constitutional: She is oriented to person, place, and time. She appears well-developed and well-nourished.  HENT:  Head: Normocephalic and atraumatic.  Right Ear: External ear normal.  Left Ear: External ear normal.  Mouth/Throat: Oropharynx is clear and moist. No oropharyngeal exudate.  Neck: Normal range of motion. Neck supple.  Cardiovascular: Normal rate, regular rhythm, normal heart sounds and intact distal pulses.   Pulmonary/Chest: Effort normal and breath sounds normal.  Abdominal: Soft. Bowel sounds are normal.  Musculoskeletal: Normal range of motion.  Lymphadenopathy:    She has no cervical adenopathy.  Neurological: She is alert and oriented to person, place, and time.  Skin: Skin is warm and dry.  Psychiatric: She has a normal mood and affect.  BP 118/74 mmHg  Pulse 78  Temp(Src) 98.1 F (36.7 C) (Oral)  Ht 5' 7.33" (1.71 m)  Wt 201 lb 8 oz (91.4 kg)  BMI 31.26 kg/m2  SpO2 97%  LMP 12/17/2011         Assessment & Plan:  1. Preventative maintenance TSH, A1c, lipid panel Return in one year or as needed.

## 2015-10-23 ENCOUNTER — Other Ambulatory Visit: Payer: Self-pay | Admitting: Internal Medicine

## 2015-10-23 DIAGNOSIS — Z1231 Encounter for screening mammogram for malignant neoplasm of breast: Secondary | ICD-10-CM

## 2015-11-06 ENCOUNTER — Ambulatory Visit: Payer: 59

## 2015-12-10 DIAGNOSIS — M1712 Unilateral primary osteoarthritis, left knee: Secondary | ICD-10-CM | POA: Diagnosis not present

## 2015-12-17 ENCOUNTER — Other Ambulatory Visit: Payer: Self-pay | Admitting: Internal Medicine

## 2015-12-17 DIAGNOSIS — M1712 Unilateral primary osteoarthritis, left knee: Secondary | ICD-10-CM | POA: Diagnosis not present

## 2015-12-18 ENCOUNTER — Ambulatory Visit (INDEPENDENT_AMBULATORY_CARE_PROVIDER_SITE_OTHER): Payer: 59 | Admitting: Family Medicine

## 2015-12-18 ENCOUNTER — Encounter: Payer: Self-pay | Admitting: Family Medicine

## 2015-12-18 VITALS — BP 116/76 | HR 76 | Temp 97.8°F | Wt 199.5 lb

## 2015-12-18 DIAGNOSIS — R300 Dysuria: Secondary | ICD-10-CM | POA: Diagnosis not present

## 2015-12-18 LAB — POC URINALSYSI DIPSTICK (AUTOMATED)
BILIRUBIN UA: NEGATIVE
Blood, UA: NEGATIVE
GLUCOSE UA: NEGATIVE
Ketones, UA: NEGATIVE
LEUKOCYTES UA: NEGATIVE
NITRITE UA: NEGATIVE
Protein, UA: NEGATIVE
Spec Grav, UA: 1.01
UROBILINOGEN UA: 0.2
pH, UA: 6.5

## 2015-12-18 NOTE — Progress Notes (Signed)
Pre visit review using our clinic review tool, if applicable. No additional management support is needed unless otherwise documented below in the visit note. 

## 2015-12-18 NOTE — Patient Instructions (Signed)
Urine looked ok today.  Let's monitor symptoms for now.  Push fluids and rest. Tylenol for discomfort Try cranberry juice.

## 2015-12-18 NOTE — Progress Notes (Signed)
BP 116/76 mmHg  Pulse 76  Temp(Src) 97.8 F (36.6 C) (Oral)  Wt 199 lb 8 oz (90.493 kg)  LMP 12/17/2011   CC: UTI?  Subjective:    Patient ID: [name], female    DOB: May 01, 1956, 60 y.o.   MRN: [id number]  HPI: [name] is a 60 y.o. female presenting on 12/18/2015 for Urinary Tract Infection   Last night with shooting pain vaginal area and discomfort with voiding (not true dysuria). Today endorsing some fatigue, malaise, lower abd cramping. Mild nausea and increased frequency. This feels similar to how prior UTIs have begun.   Denies fevers/chills, flank pain, vomiting, hematuria or urgency. No respiratory sxs, rashes, vaginal discharge.   Husband has been sick at home.  Did have UTI last month on cruise - treated with 3d cipro course which fully resolved symptoms.   Relevant past medical, surgical, family and social history reviewed and updated as indicated. Interim medical history since our last visit reviewed. Allergies and medications reviewed and updated. Current Outpatient Prescriptions on File Prior to Visit  Medication Sig  . Albuterol Sulfate (PROAIR RESPICLICK) 123XX123 (90 BASE) MCG/ACT AEPB Inhale 2 puffs into the lungs 4 (four) times daily as needed.  . ARIPiprazole (ABILIFY) 2 MG tablet Take 2 mg by mouth daily.  . celecoxib (CELEBREX) 100 MG capsule TAKE 1 CAPSULE BY MOUTH 2 TIMES DAILY.  . clonazePAM (KLONOPIN) 1 MG tablet Take 1 mg by mouth 2 (two) times daily.  Marland Kitchen desvenlafaxine (PRISTIQ) 100 MG 24 hr tablet Take 100 mg by mouth daily.  . fexofenadine (ALLEGRA) 180 MG tablet Take 180 mg by mouth daily.    . Fluticasone-Salmeterol (ADVAIR DISKUS) 100-50 MCG/DOSE AEPB Inhale 1 puff into the lungs 2 (two) times daily. Rinse mouth  . hydrocortisone (ANUSOL-HC) 2.5 % rectal cream Place 1 application rectally 2 (two) times daily.  Marland Kitchen omeprazole (PRILOSEC) 20 MG capsule Take 1 capsule (20 mg total) by mouth daily.  . rizatriptan (MAXALT) 10 MG tablet Take 1  tablet (10 mg total) by mouth as needed for migraine. Take 1 by mouth 1-2 times a week migraine onset  . topiramate (TOPAMAX) 100 MG tablet Take 2 tablets (200 mg total) by mouth at bedtime.   No current facility-administered medications on file prior to visit.    Review of Systems Per HPI unless specifically indicated in ROS section     Objective:    BP 116/76 mmHg  Pulse 76  Temp(Src) 97.8 F (36.6 C) (Oral)  Wt 199 lb 8 oz (90.493 kg)  LMP 12/17/2011  Wt Readings from Last 3 Encounters:  12/18/15 199 lb 8 oz (90.493 kg)  09/27/15 201 lb 8 oz (91.4 kg)  09/14/15 202 lb (91.627 kg)    Physical Exam  Constitutional: She appears well-developed and well-nourished. No distress.  Abdominal: Soft. Normal appearance and bowel sounds are normal. She exhibits no distension and no mass. There is no hepatosplenomegaly. There is tenderness (mild) in the suprapubic area. There is no rigidity, no rebound, no guarding, no CVA tenderness and negative Murphy's sign.  Musculoskeletal: She exhibits no edema.  Psychiatric: She has a normal mood and affect.  Nursing note and vitals reviewed.  Results for orders placed or performed in visit on 12/18/15  POCT Urinalysis Dipstick (Automated)  Result Value Ref Range   Color, UA Yellow    Clarity, UA Clear    Glucose, UA Negative    Bilirubin, UA Negative    Ketones, UA Negative  Spec Grav, UA 1.010    Blood, UA Negative    pH, UA 6.5    Protein, UA Negative    Urobilinogen, UA 0.2    Nitrite, UA Negative    Leukocytes, UA Negative Negative   Micro: WBC 0 RBC 0 Bact tr Epi few Casts none    Assessment & Plan:   Problem List Items Addressed This Visit    Dysuria - Primary    sxs suspicious for UTI however UA and confirmed micro WNL.  ?beginning viral URI with husband recently sick. rec push fluids, tylenol, cranberry juice, monitor sxs for next few days. Anticipate full recovery. Update if new sxs develop or persistent UTI sxs.  Pt agrees with plan.      Relevant Orders   POCT Urinalysis Dipstick (Automated) (Completed)       Follow up plan: Return if symptoms worsen or fail to improve.

## 2015-12-18 NOTE — Assessment & Plan Note (Signed)
sxs suspicious for UTI however UA and confirmed micro WNL.  ?beginning viral URI with husband recently sick. rec push fluids, tylenol, cranberry juice, monitor sxs for next few days. Anticipate full recovery. Update if new sxs develop or persistent UTI sxs. Pt agrees with plan.

## 2015-12-24 DIAGNOSIS — M1712 Unilateral primary osteoarthritis, left knee: Secondary | ICD-10-CM | POA: Diagnosis not present

## 2016-01-15 DIAGNOSIS — F332 Major depressive disorder, recurrent severe without psychotic features: Secondary | ICD-10-CM | POA: Diagnosis not present

## 2016-01-21 DIAGNOSIS — M4602 Spinal enthesopathy, cervical region: Secondary | ICD-10-CM | POA: Diagnosis not present

## 2016-01-21 DIAGNOSIS — M50322 Other cervical disc degeneration at C5-C6 level: Secondary | ICD-10-CM | POA: Diagnosis not present

## 2016-01-21 DIAGNOSIS — M50323 Other cervical disc degeneration at C6-C7 level: Secondary | ICD-10-CM | POA: Diagnosis not present

## 2016-01-21 DIAGNOSIS — M9901 Segmental and somatic dysfunction of cervical region: Secondary | ICD-10-CM | POA: Diagnosis not present

## 2016-01-22 DIAGNOSIS — M50323 Other cervical disc degeneration at C6-C7 level: Secondary | ICD-10-CM | POA: Diagnosis not present

## 2016-01-22 DIAGNOSIS — M9901 Segmental and somatic dysfunction of cervical region: Secondary | ICD-10-CM | POA: Diagnosis not present

## 2016-01-22 DIAGNOSIS — M50322 Other cervical disc degeneration at C5-C6 level: Secondary | ICD-10-CM | POA: Diagnosis not present

## 2016-01-22 DIAGNOSIS — M4602 Spinal enthesopathy, cervical region: Secondary | ICD-10-CM | POA: Diagnosis not present

## 2016-01-24 DIAGNOSIS — M4602 Spinal enthesopathy, cervical region: Secondary | ICD-10-CM | POA: Diagnosis not present

## 2016-01-24 DIAGNOSIS — M50323 Other cervical disc degeneration at C6-C7 level: Secondary | ICD-10-CM | POA: Diagnosis not present

## 2016-01-24 DIAGNOSIS — M50322 Other cervical disc degeneration at C5-C6 level: Secondary | ICD-10-CM | POA: Diagnosis not present

## 2016-01-24 DIAGNOSIS — M9901 Segmental and somatic dysfunction of cervical region: Secondary | ICD-10-CM | POA: Diagnosis not present

## 2016-01-25 DIAGNOSIS — M50323 Other cervical disc degeneration at C6-C7 level: Secondary | ICD-10-CM | POA: Diagnosis not present

## 2016-01-25 DIAGNOSIS — M4602 Spinal enthesopathy, cervical region: Secondary | ICD-10-CM | POA: Diagnosis not present

## 2016-01-25 DIAGNOSIS — M9901 Segmental and somatic dysfunction of cervical region: Secondary | ICD-10-CM | POA: Diagnosis not present

## 2016-01-25 DIAGNOSIS — M50322 Other cervical disc degeneration at C5-C6 level: Secondary | ICD-10-CM | POA: Diagnosis not present

## 2016-01-28 DIAGNOSIS — M50322 Other cervical disc degeneration at C5-C6 level: Secondary | ICD-10-CM | POA: Diagnosis not present

## 2016-01-28 DIAGNOSIS — M9901 Segmental and somatic dysfunction of cervical region: Secondary | ICD-10-CM | POA: Diagnosis not present

## 2016-01-28 DIAGNOSIS — M50323 Other cervical disc degeneration at C6-C7 level: Secondary | ICD-10-CM | POA: Diagnosis not present

## 2016-01-28 DIAGNOSIS — M4602 Spinal enthesopathy, cervical region: Secondary | ICD-10-CM | POA: Diagnosis not present

## 2016-01-30 DIAGNOSIS — M50322 Other cervical disc degeneration at C5-C6 level: Secondary | ICD-10-CM | POA: Diagnosis not present

## 2016-01-30 DIAGNOSIS — M4602 Spinal enthesopathy, cervical region: Secondary | ICD-10-CM | POA: Diagnosis not present

## 2016-01-30 DIAGNOSIS — M9901 Segmental and somatic dysfunction of cervical region: Secondary | ICD-10-CM | POA: Diagnosis not present

## 2016-01-30 DIAGNOSIS — M50323 Other cervical disc degeneration at C6-C7 level: Secondary | ICD-10-CM | POA: Diagnosis not present

## 2016-01-31 DIAGNOSIS — M4602 Spinal enthesopathy, cervical region: Secondary | ICD-10-CM | POA: Diagnosis not present

## 2016-01-31 DIAGNOSIS — M50322 Other cervical disc degeneration at C5-C6 level: Secondary | ICD-10-CM | POA: Diagnosis not present

## 2016-01-31 DIAGNOSIS — M50323 Other cervical disc degeneration at C6-C7 level: Secondary | ICD-10-CM | POA: Diagnosis not present

## 2016-01-31 DIAGNOSIS — M9901 Segmental and somatic dysfunction of cervical region: Secondary | ICD-10-CM | POA: Diagnosis not present

## 2016-02-04 ENCOUNTER — Other Ambulatory Visit: Payer: Self-pay | Admitting: Internal Medicine

## 2016-02-04 DIAGNOSIS — M50323 Other cervical disc degeneration at C6-C7 level: Secondary | ICD-10-CM | POA: Diagnosis not present

## 2016-02-04 DIAGNOSIS — M4602 Spinal enthesopathy, cervical region: Secondary | ICD-10-CM | POA: Diagnosis not present

## 2016-02-04 DIAGNOSIS — M9901 Segmental and somatic dysfunction of cervical region: Secondary | ICD-10-CM | POA: Diagnosis not present

## 2016-02-04 DIAGNOSIS — M50322 Other cervical disc degeneration at C5-C6 level: Secondary | ICD-10-CM | POA: Diagnosis not present

## 2016-02-07 DIAGNOSIS — M4602 Spinal enthesopathy, cervical region: Secondary | ICD-10-CM | POA: Diagnosis not present

## 2016-02-07 DIAGNOSIS — M50322 Other cervical disc degeneration at C5-C6 level: Secondary | ICD-10-CM | POA: Diagnosis not present

## 2016-02-07 DIAGNOSIS — M9901 Segmental and somatic dysfunction of cervical region: Secondary | ICD-10-CM | POA: Diagnosis not present

## 2016-02-07 DIAGNOSIS — M50323 Other cervical disc degeneration at C6-C7 level: Secondary | ICD-10-CM | POA: Diagnosis not present

## 2016-02-08 DIAGNOSIS — M50323 Other cervical disc degeneration at C6-C7 level: Secondary | ICD-10-CM | POA: Diagnosis not present

## 2016-02-08 DIAGNOSIS — M4602 Spinal enthesopathy, cervical region: Secondary | ICD-10-CM | POA: Diagnosis not present

## 2016-02-08 DIAGNOSIS — M9901 Segmental and somatic dysfunction of cervical region: Secondary | ICD-10-CM | POA: Diagnosis not present

## 2016-02-08 DIAGNOSIS — M50322 Other cervical disc degeneration at C5-C6 level: Secondary | ICD-10-CM | POA: Diagnosis not present

## 2016-02-11 DIAGNOSIS — M4602 Spinal enthesopathy, cervical region: Secondary | ICD-10-CM | POA: Diagnosis not present

## 2016-02-11 DIAGNOSIS — M50323 Other cervical disc degeneration at C6-C7 level: Secondary | ICD-10-CM | POA: Diagnosis not present

## 2016-02-11 DIAGNOSIS — M9901 Segmental and somatic dysfunction of cervical region: Secondary | ICD-10-CM | POA: Diagnosis not present

## 2016-02-11 DIAGNOSIS — M50322 Other cervical disc degeneration at C5-C6 level: Secondary | ICD-10-CM | POA: Diagnosis not present

## 2016-02-14 DIAGNOSIS — M50322 Other cervical disc degeneration at C5-C6 level: Secondary | ICD-10-CM | POA: Diagnosis not present

## 2016-02-14 DIAGNOSIS — M9901 Segmental and somatic dysfunction of cervical region: Secondary | ICD-10-CM | POA: Diagnosis not present

## 2016-02-14 DIAGNOSIS — M4602 Spinal enthesopathy, cervical region: Secondary | ICD-10-CM | POA: Diagnosis not present

## 2016-02-14 DIAGNOSIS — M50323 Other cervical disc degeneration at C6-C7 level: Secondary | ICD-10-CM | POA: Diagnosis not present

## 2016-02-18 DIAGNOSIS — M4602 Spinal enthesopathy, cervical region: Secondary | ICD-10-CM | POA: Diagnosis not present

## 2016-02-18 DIAGNOSIS — M50322 Other cervical disc degeneration at C5-C6 level: Secondary | ICD-10-CM | POA: Diagnosis not present

## 2016-02-18 DIAGNOSIS — M50323 Other cervical disc degeneration at C6-C7 level: Secondary | ICD-10-CM | POA: Diagnosis not present

## 2016-02-18 DIAGNOSIS — M9901 Segmental and somatic dysfunction of cervical region: Secondary | ICD-10-CM | POA: Diagnosis not present

## 2016-02-20 DIAGNOSIS — M9901 Segmental and somatic dysfunction of cervical region: Secondary | ICD-10-CM | POA: Diagnosis not present

## 2016-02-20 DIAGNOSIS — M50322 Other cervical disc degeneration at C5-C6 level: Secondary | ICD-10-CM | POA: Diagnosis not present

## 2016-02-20 DIAGNOSIS — M4602 Spinal enthesopathy, cervical region: Secondary | ICD-10-CM | POA: Diagnosis not present

## 2016-02-20 DIAGNOSIS — M50323 Other cervical disc degeneration at C6-C7 level: Secondary | ICD-10-CM | POA: Diagnosis not present

## 2016-02-22 DIAGNOSIS — G4733 Obstructive sleep apnea (adult) (pediatric): Secondary | ICD-10-CM | POA: Diagnosis not present

## 2016-02-25 DIAGNOSIS — M1712 Unilateral primary osteoarthritis, left knee: Secondary | ICD-10-CM | POA: Diagnosis not present

## 2016-02-26 DIAGNOSIS — M9901 Segmental and somatic dysfunction of cervical region: Secondary | ICD-10-CM | POA: Diagnosis not present

## 2016-02-26 DIAGNOSIS — M4602 Spinal enthesopathy, cervical region: Secondary | ICD-10-CM | POA: Diagnosis not present

## 2016-02-26 DIAGNOSIS — M50323 Other cervical disc degeneration at C6-C7 level: Secondary | ICD-10-CM | POA: Diagnosis not present

## 2016-02-26 DIAGNOSIS — M50322 Other cervical disc degeneration at C5-C6 level: Secondary | ICD-10-CM | POA: Diagnosis not present

## 2016-02-27 DIAGNOSIS — M4602 Spinal enthesopathy, cervical region: Secondary | ICD-10-CM | POA: Diagnosis not present

## 2016-02-27 DIAGNOSIS — M9901 Segmental and somatic dysfunction of cervical region: Secondary | ICD-10-CM | POA: Diagnosis not present

## 2016-02-27 DIAGNOSIS — M50322 Other cervical disc degeneration at C5-C6 level: Secondary | ICD-10-CM | POA: Diagnosis not present

## 2016-02-27 DIAGNOSIS — M50323 Other cervical disc degeneration at C6-C7 level: Secondary | ICD-10-CM | POA: Diagnosis not present

## 2016-02-27 DIAGNOSIS — M791 Myalgia: Secondary | ICD-10-CM | POA: Diagnosis not present

## 2016-02-28 ENCOUNTER — Ambulatory Visit (INDEPENDENT_AMBULATORY_CARE_PROVIDER_SITE_OTHER): Payer: 59 | Admitting: Internal Medicine

## 2016-02-28 ENCOUNTER — Encounter: Payer: Self-pay | Admitting: Internal Medicine

## 2016-02-28 VITALS — BP 114/80 | HR 74 | Temp 97.9°F | Wt 204.0 lb

## 2016-02-28 DIAGNOSIS — R222 Localized swelling, mass and lump, trunk: Secondary | ICD-10-CM

## 2016-02-28 DIAGNOSIS — R1900 Intra-abdominal and pelvic swelling, mass and lump, unspecified site: Secondary | ICD-10-CM | POA: Diagnosis not present

## 2016-02-28 DIAGNOSIS — R635 Abnormal weight gain: Secondary | ICD-10-CM | POA: Diagnosis not present

## 2016-02-28 NOTE — Progress Notes (Signed)
Pre visit review using our clinic review tool, if applicable. No additional management support is needed unless otherwise documented below in the visit note. 

## 2016-02-28 NOTE — Progress Notes (Signed)
Subjective:    Patient ID: [name], female    DOB: 1956/07/11, 60 y.o.   MRN: [id number]  HPI  Pt presents to the clinic today to discuss weight gain. She has steadily gained weight over the last 2 years. Her BMI today is 31.64. She reports she is working hard on her diet. She is not exercising much. She had a friend who told her she should consider trying Phentermine, so she wanted to know if she could get a prescription for it.  She also complains of a lump in her abdomen. She noticed this 1 year ago. It has not gotten bigger in size. It is not painful. She is concerned because it has not gone away, and she would like it evaluated.  Review of Systems      Past Medical History  Diagnosis Date  . Allergic rhinitis   . Urticaria 1999  . Angioedema 1999  . Asthma   . Sleep apnea     uses CPAP nightly  . Anxiety   . Depression   . GERD (gastroesophageal reflux disease)   . Arthritis   . Headache     migraines  . Acute medial meniscus tear of left knee     Current Outpatient Prescriptions  Medication Sig Dispense Refill  . Albuterol Sulfate (PROAIR RESPICLICK) 299 (90 BASE) MCG/ACT AEPB Inhale 2 puffs into the lungs 4 (four) times daily as needed. 1 each prn  . ARIPiprazole (ABILIFY) 2 MG tablet Take 2 mg by mouth daily.    . celecoxib (CELEBREX) 100 MG capsule TAKE 1 CAPSULE BY MOUTH 2 TIMES DAILY. 180 capsule 1  . clonazePAM (KLONOPIN) 1 MG tablet Take 1 mg by mouth 2 (two) times daily.    Marland Kitchen desvenlafaxine (PRISTIQ) 100 MG 24 hr tablet Take 100 mg by mouth daily.    . fexofenadine (ALLEGRA) 180 MG tablet Take 180 mg by mouth daily.      . Fluticasone-Salmeterol (ADVAIR DISKUS) 100-50 MCG/DOSE AEPB Inhale 1 puff into the lungs 2 (two) times daily. Rinse mouth 3 each 3  . hydrocortisone (ANUSOL-HC) 2.5 % rectal cream Place 1 application rectally 2 (two) times daily. 30 g 0  . omeprazole (PRILOSEC) 20 MG capsule Take 1 capsule (20 mg total) by mouth daily. MUST SCHEDULE  OFFICE VISIT FOR May 2017 90 capsule 0  . rizatriptan (MAXALT) 10 MG tablet Take 1 tablet (10 mg total) by mouth as needed for migraine. Take 1 by mouth 1-2 times a week migraine onset 30 tablet 1  . topiramate (TOPAMAX) 100 MG tablet Take 2 tablets (200 mg total) by mouth at bedtime. 180 tablet 1   No current facility-administered medications for this visit.    No Known Allergies  Family History  Problem Relation Age of Onset  . COPD Father   . Arthritis Father   . Emphysema Father   . Sleep apnea Father   . Heart disease Mother   . COPD      sibling   . Arthritis      sibling    Social History   Social History  . Marital Status: Married    Spouse Name: N/A  . Number of Children: N/A  . Years of Education: N/A   Occupational History  . RN    Social History Main Topics  . Smoking status: Never Smoker   . Smokeless tobacco: Never Used  . Alcohol Use: 1.2 oz/week    2 Glasses of wine per week  Comment: social  . Drug Use: No  . Sexual Activity: Yes    Birth Control/ Protection: Surgical, Post-menopausal   Other Topics Concern  . Not on file   Social History Narrative   Positive history of passive tobacco smoke exposure.           Constitutional: Pt reports weight gain. Denies fever, malaise, fatigue, headache.  Respiratory: Denies difficulty breathing, shortness of breath, cough or sputum production.   Cardiovascular: Denies chest pain, chest tightness, palpitations or swelling in the hands or feet.  Gastrointestinal: Denies abdominal pain, bloating, constipation, diarrhea or blood in the stool.  Skin: Pt reports a lump in her abdomen. Denies redness, rashes, lesions or ulcercations.  Neurological: Denies dizziness, difficulty with memory, difficulty with speech or problems with balance and coordination.    No other specific complaints in a complete review of systems (except as listed in HPI above).  Objective:   Physical Exam   BP 114/80 mmHg   Pulse 74  Temp(Src) 97.9 F (36.6 C) (Oral)  Wt 204 lb (92.534 kg)  SpO2 99%  LMP 12/17/2011 Wt Readings from Last 3 Encounters:  02/28/16 204 lb (92.534 kg)  12/18/15 199 lb 8 oz (90.493 kg)  09/27/15 201 lb 8 oz (91.4 kg)    General: Appears her stated age, obese in NAD. Cardiovascular: Normal rate and rhythm. S1,S2 noted.  No murmur, rubs or gallops noted.  Pulmonary/Chest: Normal effort and positive vesicular breath sounds. No respiratory distress. No wheezes, rales or ronchi noted.  Abdomen: Soft and nontender. Normal bowel sounds, no bruits noted. 2.5 cm round periumbilical mass noted.  Neurological: Alert and oriented.    BMET    Component Value Date/Time   NA 141 06/15/2015 1620   K 3.9 06/15/2015 1620   CL 108 06/15/2015 1620   CO2 23 06/15/2015 1620   GLUCOSE 84 06/15/2015 1620   BUN 18 06/15/2015 1620   CREATININE 0.93 06/15/2015 1620   CREATININE 0.75 11/26/2010 1351   CALCIUM 9.0 06/15/2015 1620   GFRNONAA >60 11/26/2010 1351   GFRAA  11/26/2010 1351    >60        The eGFR has been calculated using the MDRD equation. This calculation has not been validated in all clinical situations. eGFR's persistently <60 mL/min signify possible Chronic Kidney Disease.    Lipid Panel     Component Value Date/Time   CHOL 177 09/27/2015 1510   TRIG 171.0* 09/27/2015 1510   HDL 52.40 09/27/2015 1510   CHOLHDL 3 09/27/2015 1510   VLDL 34.2 09/27/2015 1510   LDLCALC 90 09/27/2015 1510    CBC    Component Value Date/Time   WBC 7.8 06/15/2015 1620   RBC 4.34 06/15/2015 1620   HGB 12.5 06/15/2015 1620   HCT 36.7 06/15/2015 1620   PLT 295 06/15/2015 1620   MCV 84.6 06/15/2015 1620   MCH 28.8 06/15/2015 1620   MCHC 34.1 06/15/2015 1620   RDW 14.0 06/15/2015 1620   LYMPHSABS 1.9 10/27/2011 0947   MONOABS 0.6 10/27/2011 0947   EOSABS 0.2 10/27/2011 0947   BASOSABS 0.0 10/27/2011 0947    Hgb A1C Lab Results  Component Value Date   HGBA1C 5.6 09/27/2015         Assessment & Plan:   Abnormal weight gain:  Declined RX for Phentermine due to risk for serotonin syndrome Advised her we could try Saxenda- but she is not interested in an injectable Encouraged to try a program like weight watchers or nutrisystem  Encouraged her to join a gym  Abdominal wall mass:  ? Periumbilical hernia She is not interested in ultrasound at this time Will continue to monitor  RTC as needed

## 2016-02-28 NOTE — Patient Instructions (Signed)

## 2016-02-29 ENCOUNTER — Encounter: Payer: Self-pay | Admitting: Internal Medicine

## 2016-03-04 DIAGNOSIS — M50323 Other cervical disc degeneration at C6-C7 level: Secondary | ICD-10-CM | POA: Diagnosis not present

## 2016-03-04 DIAGNOSIS — M9901 Segmental and somatic dysfunction of cervical region: Secondary | ICD-10-CM | POA: Diagnosis not present

## 2016-03-04 DIAGNOSIS — M50322 Other cervical disc degeneration at C5-C6 level: Secondary | ICD-10-CM | POA: Diagnosis not present

## 2016-03-04 DIAGNOSIS — M791 Myalgia: Secondary | ICD-10-CM | POA: Diagnosis not present

## 2016-03-04 DIAGNOSIS — M4602 Spinal enthesopathy, cervical region: Secondary | ICD-10-CM | POA: Diagnosis not present

## 2016-04-30 ENCOUNTER — Other Ambulatory Visit: Payer: Self-pay | Admitting: Internal Medicine

## 2016-04-30 ENCOUNTER — Ambulatory Visit (INDEPENDENT_AMBULATORY_CARE_PROVIDER_SITE_OTHER): Payer: 59 | Admitting: Internal Medicine

## 2016-04-30 ENCOUNTER — Encounter: Payer: Self-pay | Admitting: Internal Medicine

## 2016-04-30 ENCOUNTER — Ambulatory Visit
Admission: RE | Admit: 2016-04-30 | Discharge: 2016-04-30 | Disposition: A | Discharge status: Home / Self Care | Payer: 59 | Source: Ambulatory Visit | Attending: Internal Medicine | Admitting: Internal Medicine

## 2016-04-30 VITALS — BP 116/78 | HR 81 | Temp 97.9°F | Wt 205.0 lb

## 2016-04-30 DIAGNOSIS — R110 Nausea: Secondary | ICD-10-CM | POA: Diagnosis not present

## 2016-04-30 DIAGNOSIS — Z1231 Encounter for screening mammogram for malignant neoplasm of breast: Secondary | ICD-10-CM

## 2016-04-30 DIAGNOSIS — R1013 Epigastric pain: Secondary | ICD-10-CM | POA: Diagnosis not present

## 2016-04-30 DIAGNOSIS — R197 Diarrhea, unspecified: Secondary | ICD-10-CM | POA: Diagnosis not present

## 2016-04-30 LAB — COMPREHENSIVE METABOLIC PANEL
ALBUMIN: 4.2 g/dL (ref 3.5–5.2)
ALT: 23 U/L (ref 0–35)
AST: 22 U/L (ref 0–37)
Alkaline Phosphatase: 97 U/L (ref 39–117)
BUN: 18 mg/dL (ref 6–23)
CHLORIDE: 107 meq/L (ref 96–112)
CO2: 27 mEq/L (ref 19–32)
CREATININE: 0.68 mg/dL (ref 0.40–1.20)
Calcium: 9.6 mg/dL (ref 8.4–10.5)
GFR: 94.41 mL/min (ref >60.00)
GLUCOSE: 89 mg/dL (ref 70–99)
Potassium: 3.9 mEq/L (ref 3.5–5.1)
Sodium: 139 mEq/L (ref 135–145)
Total Bilirubin: 0.3 mg/dL (ref 0.2–1.2)
Total Protein: 6.4 g/dL (ref 6.0–8.3)

## 2016-04-30 LAB — LIPASE: Lipase: 13 U/L (ref 11.0–59.0)

## 2016-04-30 LAB — H. PYLORI ANTIBODY, IGG: H Pylori IgG: NEGATIVE

## 2016-04-30 LAB — CBC
HEMATOCRIT: 40.3 % (ref 36.0–46.0)
Hemoglobin: 13.2 g/dL (ref 12.0–15.0)
MCHC: 32.8 g/dL (ref 30.0–36.0)
MCV: 85 fl (ref 78.0–100.0)
PLATELETS: 283 10*3/uL (ref 150.0–400.0)
RBC: 4.74 Mil/uL (ref 3.87–5.11)
RDW: 13.9 % (ref 11.5–15.5)
WBC: 7.7 10*3/uL (ref 4.0–10.5)

## 2016-04-30 LAB — AMYLASE: AMYLASE: 41 U/L (ref 27–131)

## 2016-04-30 NOTE — Progress Notes (Signed)
Pre visit review using our clinic review tool, if applicable. No additional management support is needed unless otherwise documented below in the visit note. 

## 2016-04-30 NOTE — Patient Instructions (Signed)

## 2016-04-30 NOTE — Progress Notes (Signed)
Subjective:    Patient ID: [name], female    DOB: 12/08/1955, 60 y.o.   MRN: [id number]  HPI  Pt presents to the clinic today with c/o abdominal cramping. This started 2-3 months ago. It is intermittent. She describes the pain as a burning sensation. She does have some associated nausea and loose stool. She denies vomiting, constipation or blood in her stool. She has not tried anything OTC. She denies vaginal or urinary complaints. She has a history of GERD and takes Prilosec daily. She also takes a Probiotic daly. She denies changes in diet and medication. She denies increase in stress. She had a colonoscopy in 2009 which was normal.  Review of Systems      Past Medical History  Diagnosis Date  . Allergic rhinitis   . Urticaria 1999  . Angioedema 1999  . Asthma   . Sleep apnea     uses CPAP nightly  . Anxiety   . Depression   . GERD (gastroesophageal reflux disease)   . Arthritis   . Headache     migraines  . Acute medial meniscus tear of left knee     Current Outpatient Prescriptions  Medication Sig Dispense Refill  . Albuterol Sulfate (PROAIR RESPICLICK) 875 (90 BASE) MCG/ACT AEPB Inhale 2 puffs into the lungs 4 (four) times daily as needed. 1 each prn  . ARIPiprazole (ABILIFY) 2 MG tablet Take 2 mg by mouth daily.    . celecoxib (CELEBREX) 100 MG capsule TAKE 1 CAPSULE BY MOUTH 2 TIMES DAILY. 180 capsule 1  . clonazePAM (KLONOPIN) 1 MG tablet Take 1 mg by mouth 2 (two) times daily.    Marland Kitchen desvenlafaxine (PRISTIQ) 100 MG 24 hr tablet Take 100 mg by mouth daily.    . fexofenadine (ALLEGRA) 180 MG tablet Take 180 mg by mouth daily.      . Fluticasone-Salmeterol (ADVAIR DISKUS) 100-50 MCG/DOSE AEPB Inhale 1 puff into the lungs 2 (two) times daily. Rinse mouth 3 each 3  . hydrocortisone (ANUSOL-HC) 2.5 % rectal cream Place 1 application rectally 2 (two) times daily. 30 g 0  . omeprazole (PRILOSEC) 20 MG capsule Take 1 capsule (20 mg total) by mouth daily. MUST SCHEDULE  OFFICE VISIT FOR May 2017 90 capsule 0  . Probiotic Product (PROBIOTIC ADVANCED PO) Take 1 tablet by mouth daily.    . rizatriptan (MAXALT) 10 MG tablet Take 1 tablet (10 mg total) by mouth as needed for migraine. Take 1 by mouth 1-2 times a week migraine onset 30 tablet 1  . topiramate (TOPAMAX) 100 MG tablet Take 2 tablets (200 mg total) by mouth at bedtime. 180 tablet 1   No current facility-administered medications for this visit.    No Known Allergies  Family History  Problem Relation Age of Onset  . COPD Father   . Arthritis Father   . Emphysema Father   . Sleep apnea Father   . Heart disease Mother   . COPD      sibling   . Arthritis      sibling    Social History   Social History  . Marital Status: Married    Spouse Name: N/A  . Number of Children: N/A  . Years of Education: N/A   Occupational History  . RN    Social History Main Topics  . Smoking status: Never Smoker   . Smokeless tobacco: Never Used  . Alcohol Use: 1.2 oz/week    2 Glasses of wine per week  Comment: social  . Drug Use: No  . Sexual Activity: Yes    Birth Control/ Protection: Surgical, Post-menopausal   Other Topics Concern  . Not on file   Social History Narrative   Positive history of passive tobacco smoke exposure.           Constitutional: Denies fever, malaise, fatigue, headache or abrupt weight changes.  Respiratory: Denies difficulty breathing, shortness of breath, cough or sputum production.   Cardiovascular: Denies chest pain, chest tightness, palpitations or swelling in the hands or feet.  Gastrointestinal: Pt reports abdominal cramping and loose stool. Denies bloating, constipation, diarrhea or blood in the stool.  GU: Denies urgency, frequency, pain with urination, burning sensation, blood in urine, odor or discharge.  No other specific complaints in a complete review of systems (except as listed in HPI above).  Objective:   Physical Exam  BP 116/78 mmHg  Pulse  81  Temp(Src) 97.9 F (36.6 C) (Oral)  Wt 205 lb (92.987 kg)  SpO2 97%  LMP 12/17/2011 Wt Readings from Last 3 Encounters:  04/30/16 205 lb (92.987 kg)  02/28/16 204 lb (92.534 kg)  12/18/15 199 lb 8 oz (90.493 kg)    General: Appears her stated age, obese in NAD. Cardiovascular: Normal rate and rhythm. S1,S2 noted.  No murmur, rubs or gallops noted.  Pulmonary/Chest: Normal effort and positive vesicular breath sounds. No respiratory distress. No wheezes, rales or ronchi noted.  Abdomen: Soft and tender in the epigastric area. Hypoactive bowel sounds. No distention or masses noted. Liver, spleen and kidneys non palpable.   BMET    Component Value Date/Time   NA 141 06/15/2015 1620   K 3.9 06/15/2015 1620   CL 108 06/15/2015 1620   CO2 23 06/15/2015 1620   GLUCOSE 84 06/15/2015 1620   BUN 18 06/15/2015 1620   CREATININE 0.93 06/15/2015 1620   CREATININE 0.75 11/26/2010 1351   CALCIUM 9.0 06/15/2015 1620   GFRNONAA >60 11/26/2010 1351   GFRAA  11/26/2010 1351    >60        The eGFR has been calculated using the MDRD equation. This calculation has not been validated in all clinical situations. eGFR's persistently <60 mL/min signify possible Chronic Kidney Disease.    Lipid Panel     Component Value Date/Time   CHOL 177 09/27/2015 1510   TRIG 171.0* 09/27/2015 1510   HDL 52.40 09/27/2015 1510   CHOLHDL 3 09/27/2015 1510   VLDL 34.2 09/27/2015 1510   LDLCALC 90 09/27/2015 1510    CBC    Component Value Date/Time   WBC 7.8 06/15/2015 1620   RBC 4.34 06/15/2015 1620   HGB 12.5 06/15/2015 1620   HCT 36.7 06/15/2015 1620   PLT 295 06/15/2015 1620   MCV 84.6 06/15/2015 1620   MCH 28.8 06/15/2015 1620   MCHC 34.1 06/15/2015 1620   RDW 14.0 06/15/2015 1620   LYMPHSABS 1.9 10/27/2011 0947   MONOABS 0.6 10/27/2011 0947   EOSABS 0.2 10/27/2011 0947   BASOSABS 0.0 10/27/2011 0947    Hgb A1C Lab Results  Component Value Date   HGBA1C 5.6 09/27/2015          Assessment & Plan:   Epigastric pain, nausea and loose stool:  Exam benign No history of lactose or gluten allergy Will check CBC, CMET, Amylase, Lipase and H Pylori Continue Probiotic for now If symptoms persist, consider referral to GI for further evaluation  Will follow up after labs, RTC as needed

## 2016-05-05 ENCOUNTER — Other Ambulatory Visit: Payer: Self-pay | Admitting: Internal Medicine

## 2016-05-05 DIAGNOSIS — R928 Other abnormal and inconclusive findings on diagnostic imaging of breast: Secondary | ICD-10-CM

## 2016-05-13 ENCOUNTER — Telehealth: Payer: Self-pay

## 2016-05-13 ENCOUNTER — Other Ambulatory Visit: Payer: Self-pay | Admitting: Internal Medicine

## 2016-05-13 DIAGNOSIS — R1013 Epigastric pain: Secondary | ICD-10-CM

## 2016-05-13 DIAGNOSIS — R195 Other fecal abnormalities: Secondary | ICD-10-CM

## 2016-05-13 NOTE — Telephone Encounter (Signed)
Pt left v/m; pt seen 04/30/16 and continues with stomach issues and pt wants to know if can get GI referral or should pt see Webb Silversmith NP first. Pt request cb.

## 2016-05-13 NOTE — Telephone Encounter (Signed)
Will go ahead and place referral to GI

## 2016-05-14 ENCOUNTER — Other Ambulatory Visit: Payer: Self-pay | Admitting: Physician Assistant

## 2016-05-14 ENCOUNTER — Telehealth: Payer: Self-pay | Admitting: Gastroenterology

## 2016-05-14 DIAGNOSIS — R1030 Lower abdominal pain, unspecified: Secondary | ICD-10-CM | POA: Diagnosis not present

## 2016-05-14 DIAGNOSIS — R1032 Left lower quadrant pain: Secondary | ICD-10-CM | POA: Diagnosis not present

## 2016-05-14 DIAGNOSIS — K5730 Diverticulosis of large intestine without perforation or abscess without bleeding: Secondary | ICD-10-CM | POA: Diagnosis not present

## 2016-05-14 NOTE — Telephone Encounter (Signed)
Left message on machine to call back  

## 2016-05-16 ENCOUNTER — Other Ambulatory Visit: Payer: Self-pay | Admitting: Internal Medicine

## 2016-05-16 ENCOUNTER — Ambulatory Visit
Admission: RE | Admit: 2016-05-16 | Discharge: 2016-05-16 | Disposition: A | Discharge status: Home / Self Care | Payer: 59 | Source: Ambulatory Visit | Attending: Internal Medicine | Admitting: Internal Medicine

## 2016-05-16 DIAGNOSIS — R928 Other abnormal and inconclusive findings on diagnostic imaging of breast: Secondary | ICD-10-CM | POA: Diagnosis present

## 2016-05-16 DIAGNOSIS — N6001 Solitary cyst of right breast: Secondary | ICD-10-CM | POA: Diagnosis not present

## 2016-05-16 DIAGNOSIS — N63 Unspecified lump in breast: Secondary | ICD-10-CM | POA: Diagnosis not present

## 2016-05-16 NOTE — Telephone Encounter (Signed)
Left message on machine to call back  

## 2016-05-19 NOTE — Telephone Encounter (Signed)
Left message on machine to call back will wait for further communication from the pt  

## 2016-05-21 ENCOUNTER — Ambulatory Visit
Admission: RE | Admit: 2016-05-21 | Discharge: 2016-05-21 | Disposition: A | Discharge status: Home / Self Care | Payer: 59 | Source: Ambulatory Visit | Attending: Physician Assistant | Admitting: Physician Assistant

## 2016-05-21 DIAGNOSIS — R1012 Left upper quadrant pain: Secondary | ICD-10-CM | POA: Diagnosis not present

## 2016-05-21 DIAGNOSIS — R1032 Left lower quadrant pain: Secondary | ICD-10-CM

## 2016-05-21 MED ORDER — IOPAMIDOL (ISOVUE-300) INJECTION 61%
100.0000 mL | Freq: Once | INTRAVENOUS | Status: AC | PRN
  Administered 2016-05-21: 100 mL via INTRAVENOUS

## 2016-06-02 ENCOUNTER — Other Ambulatory Visit: Payer: Self-pay | Admitting: Internal Medicine

## 2016-06-02 ENCOUNTER — Other Ambulatory Visit: Payer: Self-pay

## 2016-06-02 MED ORDER — ALBUTEROL SULFATE 108 (90 BASE) MCG/ACT IN AEPB: 2.0000 | INHALATION_SPRAY | Freq: Four times a day (QID) | RESPIRATORY_TRACT | Status: DC | PRN

## 2016-06-02 NOTE — Telephone Encounter (Signed)
Pt left v/m requesting proair respiclick inhaler; pt has not used in a while but is out of med now. Avie Echevaria NP has never filled. Per 06/02/16 phone note Dr Annamaria Boots pulmonologist denied due to pt needing to come in for appt. [facility] out pt pharmacy. Avie Echevaria NP out of office.Please advise. Last annual exam 09/27/15.

## 2016-06-02 NOTE — Telephone Encounter (Signed)
Patient advised.

## 2016-06-02 NOTE — Telephone Encounter (Signed)
Sent.  Needs to schedule f/u.  Thanks.

## 2016-06-05 DIAGNOSIS — G4733 Obstructive sleep apnea (adult) (pediatric): Secondary | ICD-10-CM | POA: Diagnosis not present

## 2016-06-12 DIAGNOSIS — F332 Major depressive disorder, recurrent severe without psychotic features: Secondary | ICD-10-CM | POA: Diagnosis not present

## 2016-06-16 ENCOUNTER — Other Ambulatory Visit: Payer: Self-pay | Admitting: Internal Medicine

## 2016-06-23 DIAGNOSIS — M1712 Unilateral primary osteoarthritis, left knee: Secondary | ICD-10-CM | POA: Diagnosis not present

## 2016-06-30 DIAGNOSIS — M1712 Unilateral primary osteoarthritis, left knee: Secondary | ICD-10-CM | POA: Diagnosis not present

## 2016-07-02 ENCOUNTER — Encounter: Payer: Self-pay | Admitting: Physician Assistant

## 2016-07-02 ENCOUNTER — Ambulatory Visit: Payer: Self-pay | Admitting: Physician Assistant

## 2016-07-02 ENCOUNTER — Ambulatory Visit: Payer: Self-pay | Admitting: Family Medicine

## 2016-07-02 VITALS — BP 132/80 | HR 80 | Temp 98.4°F

## 2016-07-02 DIAGNOSIS — J209 Acute bronchitis, unspecified: Secondary | ICD-10-CM

## 2016-07-02 DIAGNOSIS — R0789 Other chest pain: Secondary | ICD-10-CM

## 2016-07-02 MED ORDER — METHYLPREDNISOLONE 4 MG PO TBPK: ORAL_TABLET | ORAL | 0 refills | Status: DC

## 2016-07-02 MED ORDER — CEFDINIR 300 MG PO CAPS: 300.0000 mg | ORAL_CAPSULE | Freq: Two times a day (BID) | ORAL | 0 refills | Status: DC

## 2016-07-02 NOTE — Progress Notes (Signed)
S: C/o cough with wheezing and chest pain, denies fever, chills;  cough is dry and hacking; states she is really fatigued and her chest hurts in the center, no radiation or diaphoresis, no hx of heart disease;  denies v/d, abd pain,  Remainder ros neg  O: vitals wnl, nad, tms clear, throat injected, neck supple no lymph, lungs c t a, cv rrr, neuro intact; cough is deep and dry, ekg shows rbbb  A:  Acute bronchitis, nonspecific chest pain  P:  rx medication: omnicef, medrol dose pack,  return if not better in 3 -5 days, return earlier if worsening, Go to ER if worsening with fatigue or chest pain or sob

## 2016-07-04 ENCOUNTER — Emergency Department
Admission: EM | Admit: 2016-07-04 | Discharge: 2016-07-04 | Disposition: A | Discharge status: Home / Self Care | Payer: 59 | Attending: Emergency Medicine | Admitting: Emergency Medicine

## 2016-07-04 ENCOUNTER — Emergency Department: Payer: 59

## 2016-07-04 ENCOUNTER — Ambulatory Visit: Payer: Self-pay | Admitting: Internal Medicine

## 2016-07-04 ENCOUNTER — Encounter: Payer: Self-pay | Admitting: Emergency Medicine

## 2016-07-04 DIAGNOSIS — R0602 Shortness of breath: Secondary | ICD-10-CM | POA: Diagnosis not present

## 2016-07-04 DIAGNOSIS — Z7951 Long term (current) use of inhaled steroids: Secondary | ICD-10-CM | POA: Diagnosis not present

## 2016-07-04 DIAGNOSIS — R05 Cough: Secondary | ICD-10-CM | POA: Diagnosis not present

## 2016-07-04 DIAGNOSIS — J40 Bronchitis, not specified as acute or chronic: Secondary | ICD-10-CM | POA: Insufficient documentation

## 2016-07-04 DIAGNOSIS — Z79899 Other long term (current) drug therapy: Secondary | ICD-10-CM | POA: Diagnosis not present

## 2016-07-04 LAB — CBC WITH DIFFERENTIAL/PLATELET
Basophils Absolute: 0 10*3/uL (ref 0–0.1)
Basophils Relative: 0 %
EOS ABS: 0 10*3/uL (ref 0–0.7)
Eosinophils Relative: 0 %
HCT: 40.9 % (ref 35.0–47.0)
HEMOGLOBIN: 13.9 g/dL (ref 12.0–16.0)
LYMPHS ABS: 1.3 10*3/uL (ref 1.0–3.6)
LYMPHS PCT: 10 %
MCH: 28.8 pg (ref 26.0–34.0)
MCHC: 34 g/dL (ref 32.0–36.0)
MCV: 84.6 fL (ref 80.0–100.0)
Monocytes Absolute: 0.6 10*3/uL (ref 0.2–0.9)
Monocytes Relative: 5 %
NEUTROS PCT: 85 %
Neutro Abs: 10.7 10*3/uL — ABNORMAL HIGH (ref 1.4–6.5)
Platelets: 289 10*3/uL (ref 150–440)
RBC: 4.83 MIL/uL (ref 3.80–5.20)
RDW: 13.7 % (ref 11.5–14.5)
WBC: 12.7 10*3/uL — AB (ref 3.6–11.0)

## 2016-07-04 LAB — COMPREHENSIVE METABOLIC PANEL
ALK PHOS: 105 U/L (ref 38–126)
ALT: 19 U/L (ref 14–54)
AST: 19 U/L (ref 15–41)
Albumin: 4.3 g/dL (ref 3.5–5.0)
Anion gap: 9 (ref 5–15)
BUN: 16 mg/dL (ref 6–20)
CALCIUM: 9.6 mg/dL (ref 8.9–10.3)
CO2: 22 mmol/L (ref 22–32)
CREATININE: 0.65 mg/dL (ref 0.44–1.00)
Chloride: 110 mmol/L (ref 101–111)
GFR calc non Af Amer: >60 mL/min (ref >60)
Glucose, Bld: 128 mg/dL — ABNORMAL HIGH (ref 65–99)
Potassium: 3.7 mmol/L (ref 3.5–5.1)
SODIUM: 141 mmol/L (ref 135–145)
Total Bilirubin: 0.4 mg/dL (ref 0.3–1.2)
Total Protein: 7.1 g/dL (ref 6.5–8.1)

## 2016-07-04 LAB — TROPONIN I: Troponin I: <0.03 ng/mL (ref <0.03)

## 2016-07-04 MED ORDER — ONDANSETRON HCL 4 MG/2ML IJ SOLN
4.0000 mg | Freq: Once | INTRAMUSCULAR | Status: AC
  Administered 2016-07-04: 4 mg via INTRAVENOUS
  Filled 2016-07-04: qty 2

## 2016-07-04 MED ORDER — METHYLPREDNISOLONE SODIUM SUCC 125 MG IJ SOLR
125.0000 mg | Freq: Once | INTRAMUSCULAR | Status: AC
  Administered 2016-07-04: 125 mg via INTRAVENOUS
  Filled 2016-07-04: qty 2

## 2016-07-04 MED ORDER — IPRATROPIUM BROMIDE 0.02 % IN SOLN
0.5000 mg | Freq: Once | RESPIRATORY_TRACT | Status: AC
  Administered 2016-07-04: 0.5 mg via RESPIRATORY_TRACT
  Filled 2016-07-04: qty 2.5

## 2016-07-04 MED ORDER — PREDNISONE 20 MG PO TABS: ORAL_TABLET | ORAL | 0 refills | Status: DC

## 2016-07-04 MED ORDER — ONDANSETRON HCL 4 MG PO TABS: 4.0000 mg | ORAL_TABLET | Freq: Three times a day (TID) | ORAL | 1 refills | Status: DC | PRN

## 2016-07-04 MED ORDER — ALBUTEROL SULFATE (2.5 MG/3ML) 0.083% IN NEBU
5.0000 mg | INHALATION_SOLUTION | Freq: Once | RESPIRATORY_TRACT | Status: AC
  Administered 2016-07-04: 5 mg via RESPIRATORY_TRACT
  Filled 2016-07-04: qty 6

## 2016-07-04 NOTE — ED Notes (Signed)
Patient reports nausea has improved with Zofran. Requesting Gingerale. Pt given glass of gingerale. No change with chest discomfort.

## 2016-07-04 NOTE — Discharge Instructions (Signed)
Stay hydrated.   Increase steroids as prescribed.   Continue albuterol every 4 hrs for 2 days then as needed   See your doctor.   Return to ER if you have worse shortness of breath, chest pain, fevers, wheezing.

## 2016-07-04 NOTE — ED Triage Notes (Signed)
Pt with shortness of breath for four to five days, worse today. Was started on prednisone and antibiotics earlier in the week and not feeling any better, states feels heaviness in her chest.

## 2016-07-04 NOTE — ED Notes (Signed)
Pt reports after coming back from XR of feeling nauseated and states she thinks it is her nerves making her nauseated.

## 2016-07-04 NOTE — ED Provider Notes (Signed)
[facility] Provider Note   CSN: [id number] Arrival date & time: 07/04/16  [id number]  First Provider Contact:  First MD Initiated Contact with Patient 07/04/16 [phone]        History   Chief Complaint Chief Complaint  Patient presents with  . Shortness of Breath    HPI [name] is a 60 y.o. female hx of Depression, reflux, bronchitis here presenting with cough, shortness of breath. Patient states that she's been coughing for the last 3-4 days. Went to her primary care doctor 2 days ago and was diagnosed with possible bronchitis. She was discharged home with Medrol Dosepak as well as Omnicef. She also has some albuterol at home and has been using it and didn't help. Says she has some tightness in her chest and has persistent cough that is nonproductive. Denies any fevers or chills. She states that she never smoked in the past and does not have a history of COPD. Denies any cardiac history. Denies recent travel or leg swelling or hx of DVT/PE.   The history is provided by the patient.    Past Medical History:  Diagnosis Date  . Acute medial meniscus tear of left knee   . Allergic rhinitis   . Angioedema 1999  . Anxiety   . Arthritis   . Asthma   . Depression   . GERD (gastroesophageal reflux disease)   . Headache    migraines  . Sleep apnea    uses CPAP nightly  . Urticaria 1999    Patient Active Problem List   Diagnosis Date Noted  . Arthritis of knee 06/15/2015  . Intractable menstrual migraine without status migrainosus 06/15/2015  . Anxiety and depression 06/15/2015  . Acute medial meniscus tear of left knee 01/15/2015  . Obstructive sleep apnea 08/03/2014  . GERD 05/14/2010  . Seasonal allergies 09/25/2007  . Asthma, mild intermittent, well-controlled 09/25/2007    Past Surgical History:  Procedure Laterality Date  . AUGMENTATION MAMMAPLASTY Bilateral 1996  . BACK SURGERY  1999   lumb tumor  . BREAST EXCISIONAL BIOPSY Right 1990's   benign  lymph node removal  . BUNIONECTOMY  2010  . CESAREAN SECTION    . CHEILECTOMY  10/28/2011   Procedure: CHEILECTOMY;  Surgeon: Lorn Junes, MD;  Location: [place];  Service: Orthopedics;  Laterality: Right;  right great toe hallux rigidus with cheilectomy 1st metatarsophalangeal  . CHONDROPLASTY Left 01/16/2015   Procedure: CHONDROPLASTY;  Surgeon: Lorn Junes, MD;  Location: [place];  Service: Orthopedics;  Laterality: Left;  . KNEE ARTHROSCOPY WITH MEDIAL MENISECTOMY Left 01/16/2015   Procedure: LEFT KNEE SCOPE MEDIAL MENISECTOMY;  Surgeon: Lorn Junes, MD;  Location: [place];  Service: Orthopedics;  Laterality: Left;  . LATERAL EPICONDYLE RELEASE  2007   right  . PLACEMENT OF BREAST IMPLANTS    . SHOULDER ARTHROSCOPY W/ ROTATOR CUFF REPAIR  2009  . SHOULDER ARTHROSCOPY W/ SUBACROMIAL DECOMPRESSION AND DISTAL CLAVICLE EXCISION  2011   left  . TONSILLECTOMY    . TUBAL LIGATION      OB History    No data available       Home Medications    Prior to Admission medications   Medication Sig Start Date End Date Taking? Authorizing Provider  Albuterol Sulfate (PROAIR RESPICLICK) [id number] (90 Base) MCG/ACT AEPB Inhale 2 puffs into the lungs 4 (four) times daily as needed. 06/02/16  Yes Tonia Ghent, MD  ARIPiprazole (ABILIFY) 2 MG  tablet Take 2 mg by mouth daily.   Yes Historical Provider, MD  cefdinir (OMNICEF) 300 MG capsule Take 1 capsule (300 mg total) by mouth 2 (two) times daily. 07/02/16  Yes Versie Starks, PA-C  celecoxib (CELEBREX) 100 MG capsule TAKE 1 CAPSULE BY MOUTH 2 TIMES DAILY. 06/17/16  Yes Jearld Fenton, NP  clonazePAM (KLONOPIN) 1 MG tablet Take 1 mg by mouth 2 (two) times daily.   Yes Historical Provider, MD  desvenlafaxine (PRISTIQ) 100 MG 24 hr tablet Take 100 mg by mouth daily.   Yes Historical Provider, MD  fexofenadine (ALLEGRA) 180 MG tablet Take 180 mg by mouth daily.     Yes Historical Provider, MD    Fluticasone-Salmeterol (ADVAIR DISKUS) 100-50 MCG/DOSE AEPB Inhale 1 puff into the lungs 2 (two) times daily. Rinse mouth 09/27/15 05/21/19 Yes Jearld Fenton, NP  methylPREDNISolone (MEDROL DOSEPAK) 4 MG TBPK tablet Take 6 pills on day one then decrease by 1 pill each day 07/02/16  Yes Versie Starks, PA-C  omeprazole (PRILOSEC) 20 MG capsule Take 1 capsule (20 mg total) by mouth daily. 05/05/16  Yes Jearld Fenton, NP  Probiotic Product (PROBIOTIC ADVANCED PO) Take 1 tablet by mouth daily.   Yes Historical Provider, MD  rizatriptan (MAXALT) 10 MG tablet Take 1 tablet (10 mg total) by mouth as needed for migraine. Take 1 by mouth 1-2 times a week migraine onset 06/15/15  Yes Jearld Fenton, NP  topiramate (TOPAMAX) 100 MG tablet Take 2 tablets (200 mg total) by mouth at bedtime. 06/15/15  Yes Jearld Fenton, NP  hydrocortisone (ANUSOL-HC) 2.5 % rectal cream Place 1 application rectally 2 (two) times daily. 09/14/15   Jearld Fenton, NP    Family History Family History  Problem Relation Age of Onset  . COPD Father   . Arthritis Father   . Emphysema Father   . Sleep apnea Father   . Heart disease Mother   . COPD      sibling   . Arthritis      sibling    Social History Social History  Substance Use Topics  . Smoking status: Never Smoker  . Smokeless tobacco: Never Used  . Alcohol use 1.2 oz/week    2 Glasses of wine per week     Comment: social     Allergies   Review of patient's allergies indicates no known allergies.   Review of Systems Review of Systems  Respiratory: Positive for cough and shortness of breath.   All other systems reviewed and are negative.    Physical Exam Updated Vital Signs BP 111/70   Pulse 90   Temp 98.2 F (36.8 C) (Oral)   Resp 20   Ht 5\' 9"  (1.753 m)   Wt 205 lb (93 kg)   LMP 12/17/2011   SpO2 100%   BMI 30.27 kg/m   Physical Exam  Constitutional: She is oriented to person, place, and time. She appears well-developed and well-nourished.   HENT:  Head: Normocephalic.  Eyes: EOM are normal. Pupils are equal, round, and reactive to light.  Neck: Normal range of motion. Neck supple.  Cardiovascular: Normal rate, regular rhythm and normal heart sounds.   Pulmonary/Chest:  Slightly tachypneic, minimal wheezing throughout, ? Crackles R base   Abdominal: Soft. Bowel sounds are normal.  Musculoskeletal: Normal range of motion. She exhibits no edema or tenderness.  Neurological: She is alert and oriented to person, place, and time. No cranial nerve deficit. Coordination normal.  Skin: Skin is warm.  Psychiatric: She has a normal mood and affect.  Nursing note and vitals reviewed.    ED Treatments / Results  Labs (all labs ordered are listed, but only abnormal results are displayed) Labs Reviewed  CBC WITH DIFFERENTIAL/PLATELET - Abnormal; Notable for the following:       Result Value   WBC 12.7 (*)    Neutro Abs 10.7 (*)    All other components within normal limits  COMPREHENSIVE METABOLIC PANEL - Abnormal; Notable for the following:    Glucose, Bld 128 (*)    All other components within normal limits  TROPONIN I    EKG  EKG Interpretation None      ED ECG REPORT I, Wandra Arthurs, the attending physician, personally viewed and interpreted this ECG.   Date: 07/04/2016  EKG Time: 9:48 am  Rate: 96  Rhythm: normal EKG, normal sinus rhythm  Axis: normal  Intervals:right bundle branch block and left anterior fascicular block  ST&T Change: nonspecific   Radiology Dg Chest 2 View  Result Date: 07/04/2016 CLINICAL DATA:  Cough and shortness of breath for several days, initial encounter EXAM: CHEST  2 VIEW COMPARISON:  03/18/2010 FINDINGS: The heart size and mediastinal contours are within normal limits. Both lungs are clear. The visualized skeletal structures are unremarkable. IMPRESSION: No active cardiopulmonary disease. Electronically Signed   By: Inez Catalina M.D.   On: 07/04/2016 10:27    Procedures Procedures  (including critical care time)  Medications Ordered in ED Medications  albuterol (PROVENTIL) (2.5 MG/3ML) 0.083% nebulizer solution 5 mg (5 mg Nebulization Given 07/04/16 1010)  ipratropium (ATROVENT) nebulizer solution 0.5 mg (0.5 mg Nebulization Given 07/04/16 1023)  ondansetron (ZOFRAN) injection 4 mg (4 mg Intravenous Given 07/04/16 1021)  methylPREDNISolone sodium succinate (SOLU-MEDROL) 125 mg/2 mL injection 125 mg (125 mg Intravenous Given 07/04/16 1126)     Initial Impression / Assessment and Plan / ED Course  I have reviewed the triage vital signs and the nursing notes.  Pertinent labs & imaging results that were available during my care of the patient were reviewed by me and considered in my medical decision making (see chart for details).  Clinical Course    MIKESHA COMP is a 60 y.o. female here with persistent cough, chest pressure. No hx of CAD. Low risk for ACS and symptoms for several days so trop x 1 sufficient. Not tachycardic and I doubt PE. I think likely persistent bronchitis vs pneumonia. Will get labs, CXR, EKG.   11:37 AM CXR clear. Labs showed WBC 12, likely from steroid use. No wheezing after neb and IV solumedrol. Still feeling some pressure but has no chest pain. Tolerated PO in the ED. Never hypoxic or tachycardic. Will increase steroids dose for bronchitis.     Final Clinical Impressions(s) / ED Diagnoses   Final diagnoses:  None    New Prescriptions New Prescriptions   No medications on file     Drenda Freeze, MD 07/04/16 1138

## 2016-07-07 DIAGNOSIS — M1712 Unilateral primary osteoarthritis, left knee: Secondary | ICD-10-CM | POA: Diagnosis not present

## 2016-07-10 ENCOUNTER — Ambulatory Visit: Payer: Self-pay | Admitting: Gastroenterology

## 2016-07-16 ENCOUNTER — Emergency Department
Admission: EM | Admit: 2016-07-16 | Discharge: 2016-07-16 | Disposition: A | Discharge status: Home / Self Care | Payer: 59 | Attending: Emergency Medicine | Admitting: Emergency Medicine

## 2016-07-16 DIAGNOSIS — R112 Nausea with vomiting, unspecified: Secondary | ICD-10-CM | POA: Insufficient documentation

## 2016-07-16 DIAGNOSIS — J4520 Mild intermittent asthma, uncomplicated: Secondary | ICD-10-CM | POA: Insufficient documentation

## 2016-07-16 DIAGNOSIS — R197 Diarrhea, unspecified: Secondary | ICD-10-CM | POA: Diagnosis not present

## 2016-07-16 DIAGNOSIS — A029 Salmonella infection, unspecified: Secondary | ICD-10-CM

## 2016-07-16 DIAGNOSIS — A09 Infectious gastroenteritis and colitis, unspecified: Secondary | ICD-10-CM

## 2016-07-16 DIAGNOSIS — E860 Dehydration: Secondary | ICD-10-CM | POA: Diagnosis not present

## 2016-07-16 LAB — COMPREHENSIVE METABOLIC PANEL
ALT: 21 U/L (ref 14–54)
AST: 21 U/L (ref 15–41)
Albumin: 3.9 g/dL (ref 3.5–5.0)
Alkaline Phosphatase: 93 U/L (ref 38–126)
Anion gap: 8 (ref 5–15)
BUN: 23 mg/dL — ABNORMAL HIGH (ref 6–20)
CHLORIDE: 105 mmol/L (ref 101–111)
CO2: 21 mmol/L — ABNORMAL LOW (ref 22–32)
CREATININE: 0.93 mg/dL (ref 0.44–1.00)
Calcium: 9.2 mg/dL (ref 8.9–10.3)
Glucose, Bld: 152 mg/dL — ABNORMAL HIGH (ref 65–99)
POTASSIUM: 3.5 mmol/L (ref 3.5–5.1)
Sodium: 134 mmol/L — ABNORMAL LOW (ref 135–145)
TOTAL PROTEIN: 7.4 g/dL (ref 6.5–8.1)
Total Bilirubin: 0.9 mg/dL (ref 0.3–1.2)

## 2016-07-16 LAB — CBC WITH DIFFERENTIAL/PLATELET
BASOS ABS: 0 10*3/uL (ref 0–0.1)
Basophils Relative: 0 %
EOS ABS: 0 10*3/uL (ref 0–0.7)
Eosinophils Relative: 0 %
HCT: 47.7 % — ABNORMAL HIGH (ref 35.0–47.0)
Hemoglobin: 15.8 g/dL (ref 12.0–16.0)
LYMPHS ABS: 0.6 10*3/uL — AB (ref 1.0–3.6)
LYMPHS PCT: 4 %
MCH: 28.2 pg (ref 26.0–34.0)
MCHC: 33 g/dL (ref 32.0–36.0)
MCV: 85.3 fL (ref 80.0–100.0)
Monocytes Absolute: 0.7 10*3/uL (ref 0.2–0.9)
Monocytes Relative: 4 %
NEUTROS PCT: 92 %
Neutro Abs: 16.1 10*3/uL — ABNORMAL HIGH (ref 1.4–6.5)
Platelets: 274 10*3/uL (ref 150–440)
RBC: 5.6 MIL/uL — AB (ref 3.80–5.20)
RDW: 14 % (ref 11.5–14.5)
WBC: 17.5 10*3/uL — AB (ref 3.6–11.0)

## 2016-07-16 LAB — GASTROINTESTINAL PANEL BY PCR, STOOL (REPLACES STOOL CULTURE)
Adenovirus F40/41: NOT DETECTED
Astrovirus: NOT DETECTED
CAMPYLOBACTER SPECIES: NOT DETECTED
CRYPTOSPORIDIUM: NOT DETECTED
Cyclospora cayetanensis: NOT DETECTED
E. coli O157: NOT DETECTED
ENTEROPATHOGENIC E COLI (EPEC): NOT DETECTED
Entamoeba histolytica: NOT DETECTED
Enteroaggregative E coli (EAEC): NOT DETECTED
Enterotoxigenic E coli (ETEC): NOT DETECTED
Giardia lamblia: NOT DETECTED
NOROVIRUS GI/GII: NOT DETECTED
PLESIMONAS SHIGELLOIDES: NOT DETECTED
ROTAVIRUS A: NOT DETECTED
SALMONELLA SPECIES: DETECTED — AB
SHIGA LIKE TOXIN PRODUCING E COLI (STEC): NOT DETECTED
SHIGELLA/ENTEROINVASIVE E COLI (EIEC): NOT DETECTED
Sapovirus (I, II, IV, and V): NOT DETECTED
Vibrio cholerae: NOT DETECTED
Vibrio species: NOT DETECTED
YERSINIA ENTEROCOLITICA: NOT DETECTED

## 2016-07-16 LAB — C DIFFICILE QUICK SCREEN W PCR REFLEX
C DIFFICILE (CDIFF) INTERP: NOT DETECTED
C DIFFICILE (CDIFF) TOXIN: NEGATIVE
C DIFFICLE (CDIFF) ANTIGEN: NEGATIVE

## 2016-07-16 LAB — LIPASE, BLOOD: Lipase: 14 U/L (ref 11–51)

## 2016-07-16 MED ORDER — ONDANSETRON 4 MG PO TBDP: 4.0000 mg | ORAL_TABLET | Freq: Once | ORAL | Status: DC

## 2016-07-16 MED ORDER — SODIUM CHLORIDE 0.9 % IV BOLUS (SEPSIS)
1000.0000 mL | Freq: Once | INTRAVENOUS | Status: AC
  Administered 2016-07-16: 1000 mL via INTRAVENOUS

## 2016-07-16 MED ORDER — ACETAMINOPHEN 500 MG PO TABS
1000.0000 mg | ORAL_TABLET | Freq: Once | ORAL | Status: AC
  Administered 2016-07-16: 1000 mg via ORAL
  Filled 2016-07-16: qty 2

## 2016-07-16 NOTE — ED Provider Notes (Addendum)
[facility] Emergency Department Provider Note  ____________________________________________   I have reviewed the triage vital signs and the nursing notes.   HISTORY  Chief Complaint Diarrhea and Abdominal Pain    HPI [name] is a 60 y.o. female who recently completed a course of Omnicef for a cough started having copious watery diarrhea starting last night. Also vomited 2. Denies abdominal pain. Denies fever. Denies melena or bright red blood per rectum. No recent travel no recent camping, no sick contacts. Does not recall eating in a restaurant recently. She is concerned that she might have C. difficile.She works in a [employer] and she had recent antibiotics.     Past Medical History:  Diagnosis Date  . Acute medial meniscus tear of left knee   . Allergic rhinitis   . Angioedema 1999  . Anxiety   . Arthritis   . Asthma   . Depression   . GERD (gastroesophageal reflux disease)   . Headache    migraines  . Sleep apnea    uses CPAP nightly  . Urticaria 1999    Patient Active Problem List   Diagnosis Date Noted  . Arthritis of knee 06/15/2015  . Intractable menstrual migraine without status migrainosus 06/15/2015  . Anxiety and depression 06/15/2015  . Acute medial meniscus tear of left knee 01/15/2015  . Obstructive sleep apnea 08/03/2014  . GERD 05/14/2010  . Seasonal allergies 09/25/2007  . Asthma, mild intermittent, well-controlled 09/25/2007    Past Surgical History:  Procedure Laterality Date  . AUGMENTATION MAMMAPLASTY Bilateral 1996  . BACK SURGERY  1999   lumb tumor  . BREAST EXCISIONAL BIOPSY Right 1990's   benign lymph node removal  . BUNIONECTOMY  2010  . CESAREAN SECTION    . CHEILECTOMY  10/28/2011   Procedure: CHEILECTOMY;  Surgeon: Lorn Junes, MD;  Location: [place];  Service: Orthopedics;  Laterality: Right;  right great toe hallux rigidus with cheilectomy 1st metatarsophalangeal   . CHONDROPLASTY Left 01/16/2015   Procedure: CHONDROPLASTY;  Surgeon: Lorn Junes, MD;  Location: [place];  Service: Orthopedics;  Laterality: Left;  . KNEE ARTHROSCOPY WITH MEDIAL MENISECTOMY Left 01/16/2015   Procedure: LEFT KNEE SCOPE MEDIAL MENISECTOMY;  Surgeon: Lorn Junes, MD;  Location: [place];  Service: Orthopedics;  Laterality: Left;  . LATERAL EPICONDYLE RELEASE  2007   right  . PLACEMENT OF BREAST IMPLANTS    . SHOULDER ARTHROSCOPY W/ ROTATOR CUFF REPAIR  2009  . SHOULDER ARTHROSCOPY W/ SUBACROMIAL DECOMPRESSION AND DISTAL CLAVICLE EXCISION  2011   left  . TONSILLECTOMY    . TUBAL LIGATION      Current Outpatient Rx  . Order #: [id number] Class: Normal  . Order #: [id number] Class: Historical Med  . Order #: [id number] Class: Normal  . Order #: [id number] Class: Normal  . Order #: [id number] Class: Historical Med  . Order #: [id number] Class: Historical Med  . Order #: [id number] Class: Historical Med  . Order #: [id number] Class: Normal  . Order #: [id number] Class: Normal  . Order #: [id number] Class: Normal  . Order #: [id number] Class: Normal  . Order #: [id number] Class: Print  . Order #: [id number] Class: Print  . Order #: [id number] Class: Historical Med  . Order #: [id number] Class: Normal  . Order #: [id number] Class: Normal    Allergies Review of patient's allergies indicates no known allergies.  Family History  Problem Relation Age of Onset  . COPD Father   . Arthritis Father   . Emphysema  Father   . Sleep apnea Father   . Heart disease Mother   . COPD      sibling   . Arthritis      sibling    Social History Social History  Substance Use Topics  . Smoking status: Never Smoker  . Smokeless tobacco: Never Used  . Alcohol use 1.2 oz/week    2 Glasses of wine per week     Comment: social    Review of Systems Constitutional: No fever/chills Eyes: No visual changes. ENT: No sore throat. No stiff neck no neck  pain Cardiovascular: Denies chest pain. Respiratory: Denies shortness of breath. Gastrointestinal:  See history of present illness  Genitourinary: Negative for dysuria. Musculoskeletal: Negative lower extremity swelling Skin: Negative for rash. Neurological: Negative for severe headaches, focal weakness or numbness. 10-point ROS otherwise negative.  ____________________________________________   PHYSICAL EXAM:  VITAL SIGNS: ED Triage Vitals [07/16/16 0740]  Enc Vitals Group     BP 121/83     Pulse Rate (!) 127     Resp 17     Temp 98.9 F (37.2 C)     Temp Source Oral     SpO2 93 %     Weight 205 lb (93 kg)     Height 5\' 9"  (1.753 m)     Head Circumference      Peak Flow      Pain Score 5     Pain Loc      Pain Edu?      Excl. in [place]?     Constitutional: Alert and oriented. Well appearing and in no acute distress. Eyes: Conjunctivae are normal. PERRL. EOMI. Head: Atraumatic. Nose: No congestion/rhinnorhea. Mouth/Throat: Mucous membranes are Slightly dry.  Oropharynx non-erythematous. Neck: No stridor.   Nontender with no meningismus Cardiovascular: Normal rate, regular rhythm. Grossly normal heart sounds.  Good peripheral circulation. Respiratory: Normal respiratory effort.  No retractions. Lungs CTAB. Abdominal: Soft and nontender. No distention. No guarding no rebound Back:  There is no focal tenderness or step off.  there is no midline tenderness there are no lesions noted. there is no CVA tenderness Musculoskeletal: No lower extremity tenderness, no upper extremity tenderness. No joint effusions, no DVT signs strong distal pulses no edema Neurologic:  Normal speech and language. No gross focal neurologic deficits are appreciated.  Skin:  Skin is warm, dry and intact. No rash noted. Psychiatric: Mood and affect are normal. Speech and behavior are normal.  ____________________________________________   LABS (all labs ordered are listed, but only abnormal results  are displayed)  Labs Reviewed  GASTROINTESTINAL PANEL BY PCR, STOOL (REPLACES STOOL CULTURE)  C DIFFICILE QUICK SCREEN W PCR REFLEX  COMPREHENSIVE METABOLIC PANEL  CBC WITH DIFFERENTIAL/PLATELET  LIPASE, BLOOD   ____________________________________________  EKG  I personally interpreted any EKGs ordered by me or triage  ____________________________________________  RADIOLOGY  I reviewed any imaging ordered by me or triage that were performed during my shift and, if possible, patient and/or family made aware of any abnormal findings. ____________________________________________   PROCEDURES  Procedure(s) performed: None  Procedures  Critical Care performed: None  ____________________________________________   INITIAL IMPRESSION / ASSESSMENT AND PLAN / ED COURSE  Pertinent labs & imaging results that were available during my care of the patient were reviewed by me and considered in my medical decision making (see chart for details).  Patient is nontoxic, has nausea vomiting diarrhea since last night. Abdomen is benign. Concern for possible C. difficile given recent antibiotics and  healthcare exposure. We will obtain stool culture and C. difficile, give her IV fluids check basic blood work and reassess. Abdomen does not require imaging at this time is to be hoped that this is a viral gastroenteritis however, given recent antibiotics we will be vigilant at about checking for other causes.   Clinical Course   ----------------------------------------- 10:19 AM on 07/16/2016 -----------------------------------------  Abdomen remains benign. Patient does not have C. difficile however she does have Salmonella. This is usually a self-limited illness. She is not having active vomiting or diarrhea here. We discussed antibiotics. And people of her state of health this is usually a self-limited disease and does not usually benefit from antibiotics. We have also discussed that  significant possible side effects of antibiotics. Patient would prefer not to have them at this time which I think is reasonable. Extensive return precautions and follow-up given and understood. ____________________________________________   FINAL CLINICAL IMPRESSION(S) / ED DIAGNOSES  Final diagnoses:  None      This chart was dictated using voice recognition software.  Despite best efforts to proofread,  errors can occur which can change meaning.       Schuyler Amor, MD 07/16/16 [id number]    Schuyler Amor, MD 07/16/16 [place], MD 07/16/16 1020

## 2016-07-16 NOTE — ED Triage Notes (Signed)
Pt c/o >15 loose watery stools in the past 24hrs with generalized abd pain.. States she finished abx about 1 week ago and is concerned for c-diff

## 2016-07-18 ENCOUNTER — Telehealth: Payer: Self-pay | Admitting: Internal Medicine

## 2016-07-18 ENCOUNTER — Ambulatory Visit (INDEPENDENT_AMBULATORY_CARE_PROVIDER_SITE_OTHER): Payer: 59 | Admitting: Family Medicine

## 2016-07-18 ENCOUNTER — Encounter: Payer: Self-pay | Admitting: Family Medicine

## 2016-07-18 DIAGNOSIS — A029 Salmonella infection, unspecified: Secondary | ICD-10-CM

## 2016-07-18 MED ORDER — ONDANSETRON HCL 4 MG PO TABS: 4.0000 mg | ORAL_TABLET | Freq: Three times a day (TID) | ORAL | 1 refills | Status: DC | PRN

## 2016-07-18 NOTE — Telephone Encounter (Signed)
[facility]  Patient Name: [name]  DOB: May 15, 1956    Initial Comment Caller States she is having severe diarrhea   Nurse Assessment  Nurse: Wynetta Emery, RN, Baker Janus Date/Time (Eastern Time): 07/18/2016 8:20:41 AM  Confirm and document reason for call. If symptomatic, describe symptoms. You must click the next button to save text entered. ---[name] was seen in ED on Wednesday and diagnosed with salmonella poisoning and has diarrhea and she is eating rice taking in pedialyte and everything goes right through 20 to 25 episodes a day  Has the patient traveled out of the country within the last 30 days? ---No  Does the patient have any new or worsening symptoms? ---Yes  Will a triage be completed? ---Yes  Related visit to physician within the last 2 weeks? ---Yes  Does the PT have any chronic conditions? (i.e. diabetes, asthma, etc.) ---Unknown  Is this a behavioral health or substance abuse call? ---No     Guidelines    Guideline Title Affirmed Question Affirmed Notes  Diarrhea [1] Constant abdominal pain AND [2] present > 2 hours    Final Disposition User   See Physician within 4 Hours (or PCP triage) Wynetta Emery, RN, Baker Janus    Comments  07-18-2016 300pm Dr. Damita Dunnings dx with salmonella on Wed. can't keep anything down 20 25 episodes of bm daily   Referrals  GO TO FACILITY UNDECIDED

## 2016-07-18 NOTE — Progress Notes (Signed)
Tuesday AM had 5 stools, then more diarrhea.   Seen at ER 07/16/16 after more and more diarrhea.  Salmonella positive.  C diff neg.  In meantime, having 20-25 stools a day until today.  Now with slightly lower volume per stool.  Now with brownish stools, not green as prev.  Urgency is less.    Not lightheaded but feels weak diffusely.  Can get to BR but then has to rest.  No vomiting now, but was dry heaving the first day.  No fevers.  Still with some nausea.  Out of zofran, prev helped.  Still with diffuse abd cramping. Still making urine.   Meds, vitals, and allergies reviewed.   ROS: Per HPI unless specifically indicated in ROS section   GEN: nad, alert and oriented HEENT: mucous membranes moist NECK: supple w/o LA CV: rrr. PULM: ctab, no inc wob ABD: soft, +bs, normal BS EXT: no edema SKIN: no acute rash, normal skin turgor

## 2016-07-18 NOTE — Progress Notes (Signed)
Pre visit review using our clinic review tool, if applicable. No additional management support is needed unless otherwise documented below in the visit note. 

## 2016-07-18 NOTE — Patient Instructions (Signed)
Plenty of fluids, skip celebrex, take zofran as needed.  Update [place] as needed.  Take care.  Glad to see you.

## 2016-07-20 DIAGNOSIS — A029 Salmonella infection, unspecified: Secondary | ICD-10-CM | POA: Insufficient documentation

## 2016-07-20 NOTE — Assessment & Plan Note (Signed)
Some better today.  She doesn't feel well but is nontoxic. Still okay for outpatient f/u.  I think she'll gradually improve.  Path/phys d/w pt.  Likely with more risk from abx use than with holding abx.  She agrees.  D/w pt to get plenty of fluids, skip celebrex, take zofran as needed.  Update [place] as needed. She agrees.

## 2016-07-22 ENCOUNTER — Other Ambulatory Visit: Payer: Self-pay | Admitting: Internal Medicine

## 2016-07-22 DIAGNOSIS — G43839 Menstrual migraine, intractable, without status migrainosus: Secondary | ICD-10-CM

## 2016-07-22 NOTE — Telephone Encounter (Signed)
Last filled 06/2015--please advise

## 2016-07-23 NOTE — Telephone Encounter (Signed)
Can you call her and see if she has been taking this or is it something she wants to restart?

## 2016-07-23 NOTE — Telephone Encounter (Signed)
Pt left v/m checking on status of refill for topamax. Pt has one pill left.Please advise.

## 2016-08-06 ENCOUNTER — Other Ambulatory Visit: Payer: Self-pay | Admitting: Internal Medicine

## 2016-09-17 ENCOUNTER — Other Ambulatory Visit: Payer: Self-pay | Admitting: Internal Medicine

## 2016-09-30 DIAGNOSIS — F332 Major depressive disorder, recurrent severe without psychotic features: Secondary | ICD-10-CM | POA: Diagnosis not present

## 2016-10-22 ENCOUNTER — Other Ambulatory Visit: Payer: Self-pay | Admitting: Internal Medicine

## 2016-10-22 DIAGNOSIS — J4530 Mild persistent asthma, uncomplicated: Secondary | ICD-10-CM

## 2016-11-25 IMAGING — CR DG CHEST 2V
1 series · 2 of 2 positions shown · non-contrast
Comparison: 03/18/2010

CLINICAL DATA: Cough and shortness of breath for several days,
initial encounter

EXAM:
CHEST  2 VIEW

[Series 1: dg chest 2 view · 0.14mm/px · 2 of 2 slices shown]
[im 1/2]
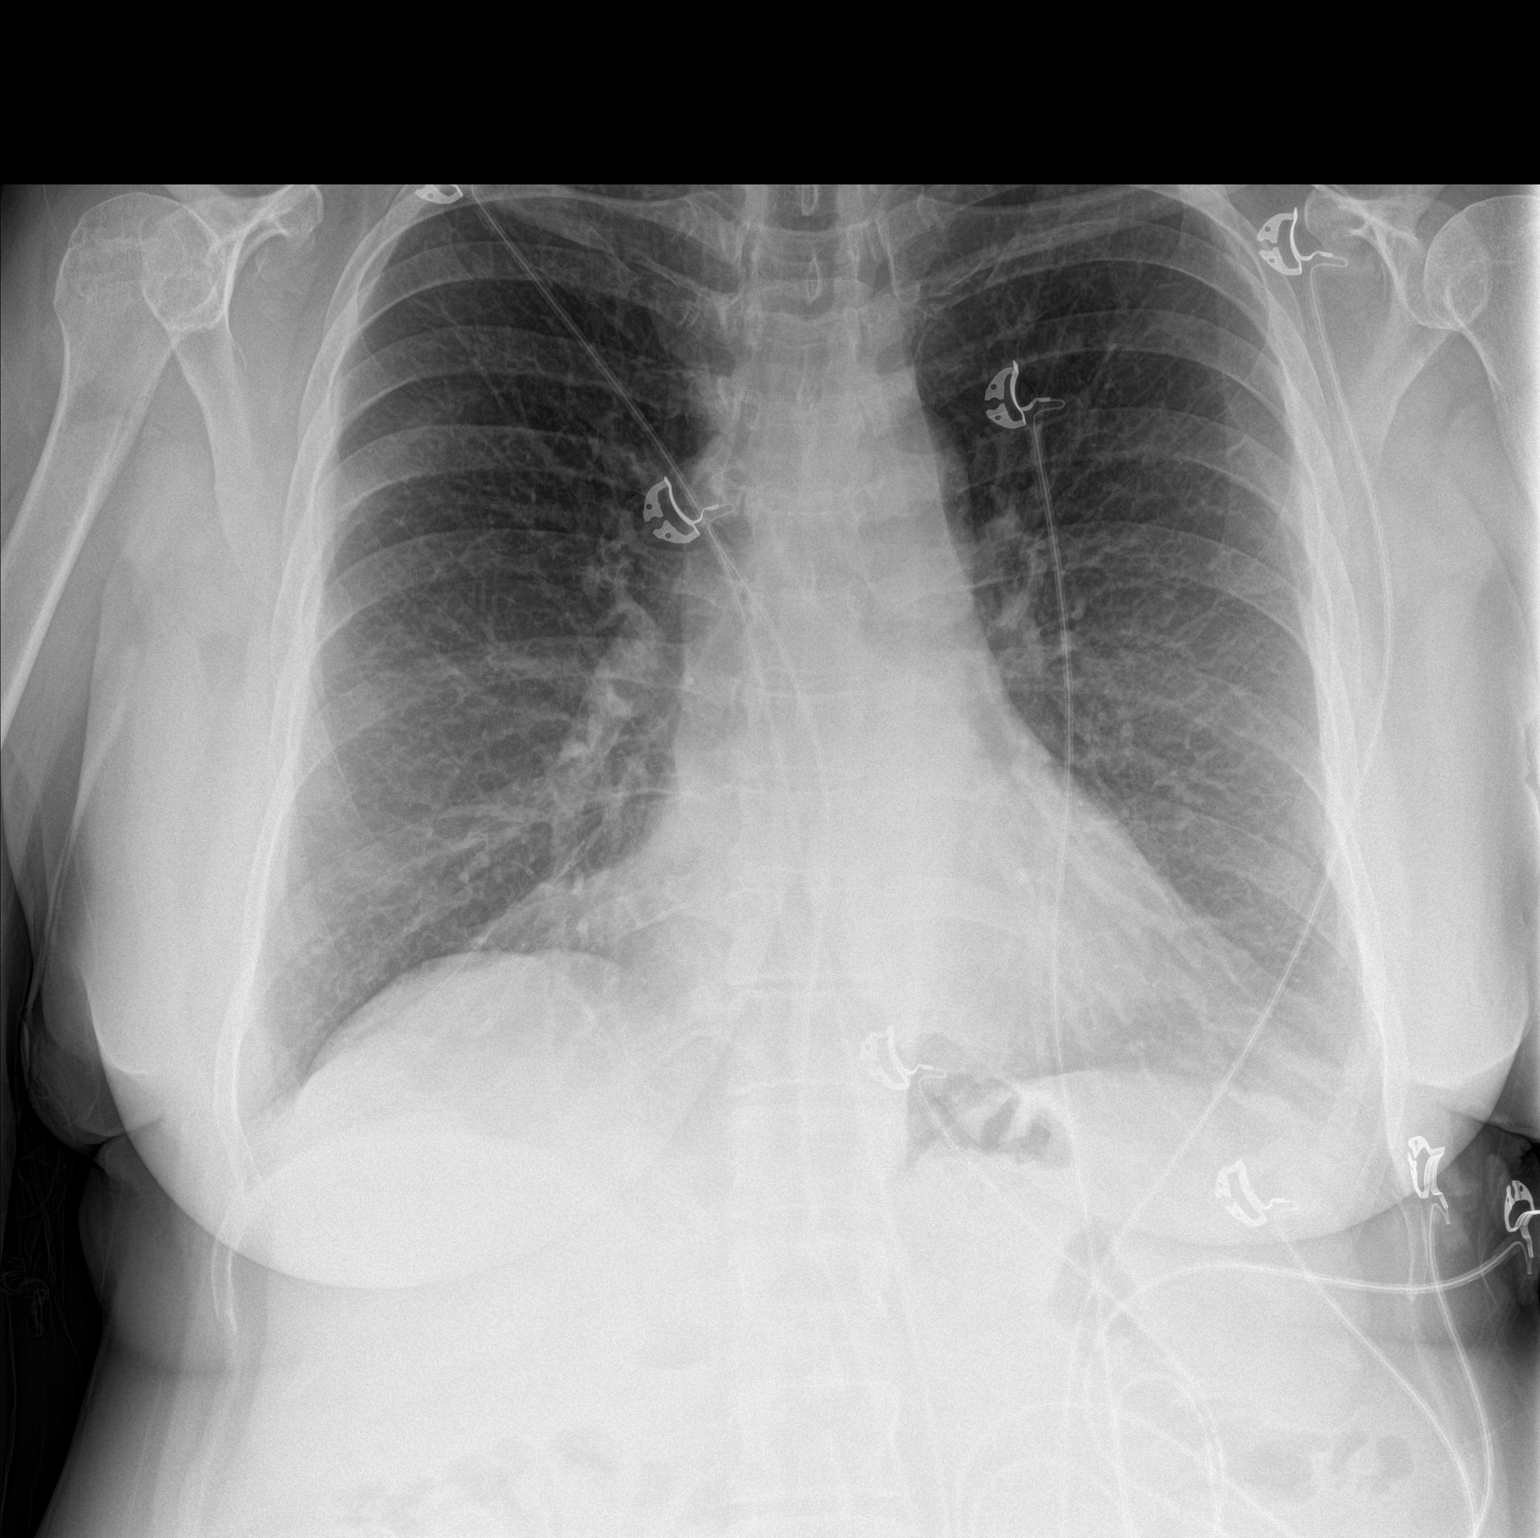
[im 2/2]
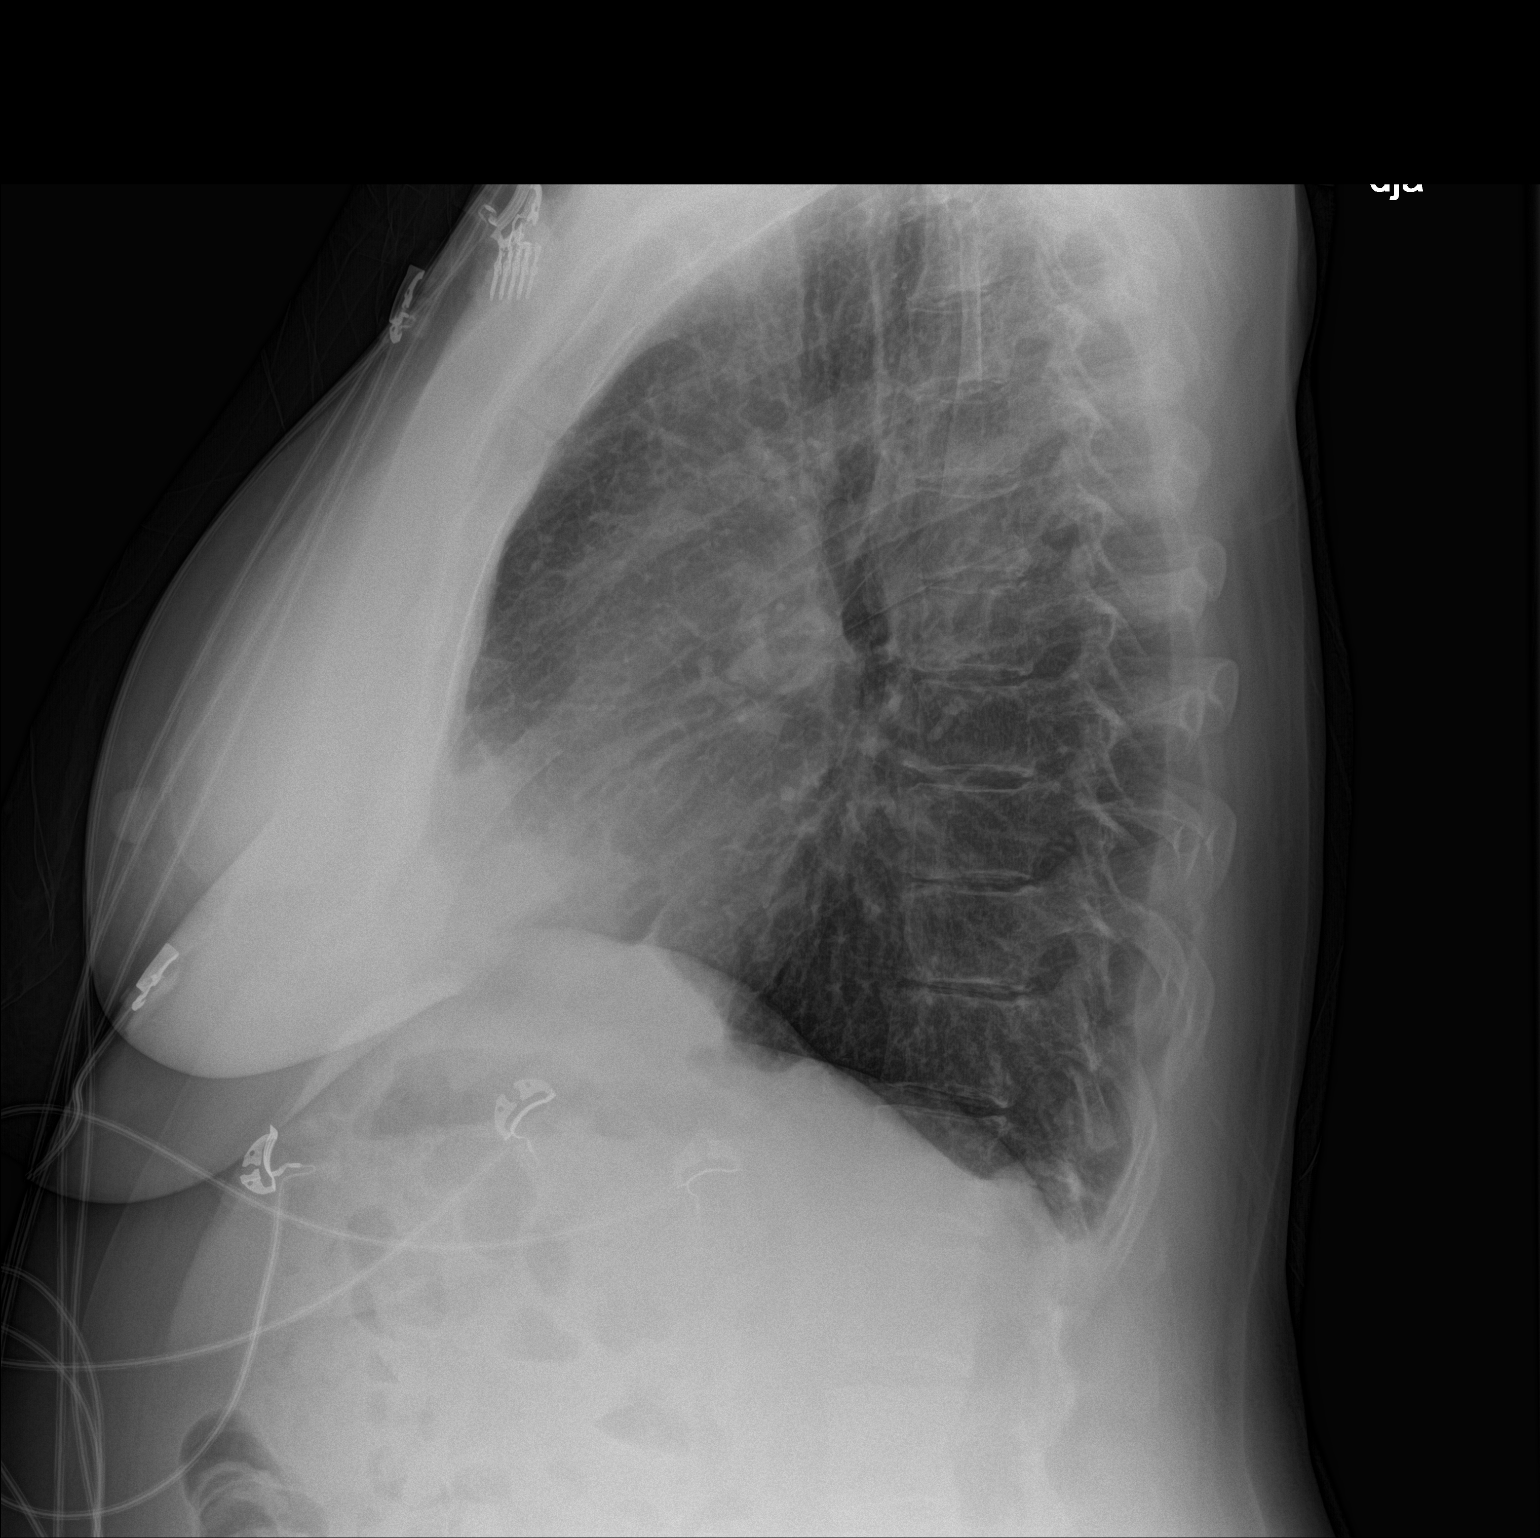

[2 of 2 positions shown; findings below may reference images not displayed]

FINDINGS: The heart size and mediastinal contours are within normal limits.
Both lungs are clear. The visualized skeletal structures are
unremarkable.
IMPRESSION: No active cardiopulmonary disease.

## 2016-12-09 ENCOUNTER — Ambulatory Visit: Payer: Self-pay | Admitting: Family

## 2016-12-09 ENCOUNTER — Encounter: Payer: Self-pay | Admitting: Physician Assistant

## 2016-12-09 ENCOUNTER — Ambulatory Visit: Payer: Self-pay | Admitting: Physician Assistant

## 2016-12-09 VITALS — BP 110/80 | HR 100 | Temp 98.6°F

## 2016-12-09 DIAGNOSIS — J209 Acute bronchitis, unspecified: Secondary | ICD-10-CM

## 2016-12-09 LAB — POCT INFLUENZA A/B
Influenza A, POC: NEGATIVE
Influenza B, POC: NEGATIVE

## 2016-12-09 MED ORDER — METHYLPREDNISOLONE 4 MG PO TBPK: ORAL_TABLET | ORAL | 0 refills | Status: DC

## 2016-12-09 MED ORDER — AZITHROMYCIN 250 MG PO TABS: ORAL_TABLET | ORAL | 0 refills | Status: DC

## 2016-12-09 MED ORDER — HYDROCOD POLST-CPM POLST ER 10-8 MG/5ML PO SUER: 5.0000 mL | Freq: Two times a day (BID) | ORAL | 0 refills | Status: DC | PRN

## 2016-12-09 NOTE — Progress Notes (Signed)
S: C/o cough and congestion with wheezing , chest is sore from coughing, no known fever, chills, ; cough is dry and hacking; keeping pt awake at night;  denies cardiac type chest pain or sob, v/d, abd pain, hx of asthmatic bronchitis Remainder ros neg  O: vitals wnl, nad, tms clear, throat injected, neck supple no lymph, lungs with coarse bs, cv rrr, neuro intact, flu swab neg  A:  Acute bronchitis   P:  rx medication:  Zpack, medrol dose pack, tussionex 150 ml nr, use inhaler; use otc meds, tylenol or motrin as needed for fever/chills, return if not better in 3 -5 days, return earlier if worsening

## 2016-12-09 NOTE — Addendum Note (Signed)
Addended by: Versie Starks on: 12/09/2016 11:15 AM   Modules accepted: Orders

## 2016-12-22 ENCOUNTER — Other Ambulatory Visit: Payer: Self-pay | Admitting: Internal Medicine

## 2016-12-30 DIAGNOSIS — F332 Major depressive disorder, recurrent severe without psychotic features: Secondary | ICD-10-CM | POA: Diagnosis not present

## 2017-01-13 DIAGNOSIS — G4733 Obstructive sleep apnea (adult) (pediatric): Secondary | ICD-10-CM | POA: Diagnosis not present

## 2017-01-19 ENCOUNTER — Other Ambulatory Visit: Payer: Self-pay | Admitting: Internal Medicine

## 2017-01-19 DIAGNOSIS — G43839 Menstrual migraine, intractable, without status migrainosus: Secondary | ICD-10-CM

## 2017-01-20 NOTE — Telephone Encounter (Signed)
Last filled 07/23/2016--please advise

## 2017-02-02 ENCOUNTER — Other Ambulatory Visit: Payer: Self-pay | Admitting: Internal Medicine

## 2017-02-20 ENCOUNTER — Encounter: Payer: Self-pay | Admitting: Physician Assistant

## 2017-02-20 ENCOUNTER — Ambulatory Visit: Payer: Self-pay | Admitting: Physician Assistant

## 2017-02-20 VITALS — BP 130/82 | HR 82 | Temp 97.6°F

## 2017-02-20 DIAGNOSIS — J069 Acute upper respiratory infection, unspecified: Secondary | ICD-10-CM

## 2017-02-20 MED ORDER — AZITHROMYCIN 250 MG PO TABS: ORAL_TABLET | ORAL | 0 refills | Status: DC

## 2017-02-20 MED ORDER — BENZONATATE 200 MG PO CAPS: 200.0000 mg | ORAL_CAPSULE | Freq: Two times a day (BID) | ORAL | 0 refills | Status: DC | PRN

## 2017-02-20 NOTE — Progress Notes (Signed)
130//82 

## 2017-02-20 NOTE — Progress Notes (Signed)
S: C/o cough, sore throat, and congestion for 4 days, no fever, chills, cp/sob, v/d; mucus was green this am but clear throughout the day, cough is sporadic,   Using otc meds: tylenol  O: PE: vitals wnl, nad, perrl eomi, normocephalic, tms dull, nasal mucosa red and swollen, throat injected, neck supple no lymph, lungs c t a, cv rrr, neuro intact  A:  Acute uri   P: drink fluids, continue regular meds , use otc meds of choice, return if not improving in 5 days, return earlier if worsening , zpack, tessalon, pt asking for tussionex told her to try tessalon first as it does not have a narcotic in it

## 2017-02-24 ENCOUNTER — Encounter: Payer: Self-pay | Admitting: Family Medicine

## 2017-02-24 ENCOUNTER — Ambulatory Visit (INDEPENDENT_AMBULATORY_CARE_PROVIDER_SITE_OTHER): Payer: 59 | Admitting: Family Medicine

## 2017-02-24 VITALS — BP 124/72 | HR 82 | Temp 98.4°F | Wt 212.6 lb

## 2017-02-24 DIAGNOSIS — R059 Cough, unspecified: Secondary | ICD-10-CM

## 2017-02-24 DIAGNOSIS — J0100 Acute maxillary sinusitis, unspecified: Secondary | ICD-10-CM | POA: Diagnosis not present

## 2017-02-24 DIAGNOSIS — J329 Chronic sinusitis, unspecified: Secondary | ICD-10-CM | POA: Insufficient documentation

## 2017-02-24 DIAGNOSIS — R05 Cough: Secondary | ICD-10-CM | POA: Diagnosis not present

## 2017-02-24 LAB — POCT INFLUENZA A/B
INFLUENZA A, POC: NEGATIVE
INFLUENZA B, POC: NEGATIVE

## 2017-02-24 MED ORDER — AMOXICILLIN-POT CLAVULANATE 875-125 MG PO TABS: 1.0000 | ORAL_TABLET | Freq: Two times a day (BID) | ORAL | 0 refills | Status: DC

## 2017-02-24 MED ORDER — PREDNISONE 10 MG PO TABS: ORAL_TABLET | ORAL | 0 refills | Status: DC

## 2017-02-24 NOTE — Patient Instructions (Signed)
Nice to see you. You likely have a sinus infection that has exacerbated your asthma. We will treat you with prednisone for your asthma. She can continue the albuterol inhaler. We'll also treat you with an antibiotic for your sinus infection. If you develop worsening trouble breathing or you develop cough productive of blood or fevers please seek medical attention immediately.

## 2017-02-24 NOTE — Assessment & Plan Note (Signed)
Patient's symptoms most consistent with sinusitis leading to exacerbation of her asthma. We will place on Augmentin to cover for sinusitis. We'll place her on a steroid taper as well. She can continue her inhaler. If she does not improve with this she will be reevaluated. She's given return precautions.

## 2017-02-24 NOTE — Progress Notes (Signed)
  Tommi Rumps, MD Phone: [phone]  [name] is a 61 y.o. female who presents today for same-day visit.  Patient notes 7 days of symptoms. Has had nasal and sinus congestion with blowing yellow mucus out of her nose. Also has had cough productive of yellow mucus. Has had some shortness of breath and wheezing with her asthma. The albuterol does not help significantly. She just feels weak and tired. No fevers or chills or body aches. Some nausea. No vomiting or diarrhea. No abdominal pain. 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 Notes no improvement with the Z-Pak.  PMH: nonsmoker.   ROS see history of present illness  Objective  Physical Exam Vitals:   02/24/17 1025  BP: 124/72  Pulse: 82  Temp: 98.4 F (36.9 C)    BP Readings from Last 3 Encounters:  02/24/17 124/72  02/20/17 130/82  12/09/16 110/80   Wt Readings from Last 3 Encounters:  02/24/17 212 lb 9.6 oz (96.4 kg)  07/18/16 197 lb 12 oz (89.7 kg)  07/16/16 205 lb (93 kg)    Physical Exam  Constitutional: No distress.  HENT:  Head: Normocephalic and atraumatic.  Mild posterior oropharyngeal erythema with no exudate, normal TMs bilaterally  Eyes: Conjunctivae are normal. Pupils are equal, round, and reactive to light.  Neck: Neck supple.  Cardiovascular: Normal rate, regular rhythm and normal heart sounds.   Pulmonary/Chest: Effort normal. No respiratory distress. She has wheezes (mild expiratory wheezes). She has no rales.  Lymphadenopathy:    She has no cervical adenopathy.  Neurological: She is alert. Gait normal.  Skin: Skin is warm and dry. She is not diaphoretic.     Assessment/Plan: Please see individual problem list.  Sinusitis Patient's symptoms most consistent with sinusitis leading to exacerbation of her asthma. We will place on Augmentin to cover for sinusitis. We'll place her on a steroid  taper as well. She can continue her inhaler. If she does not improve with this she will be reevaluated. She's given return precautions.   Orders Placed This Encounter  Procedures  . POCT Influenza A/B    Meds ordered this encounter  Medications  . predniSONE (DELTASONE) 10 MG tablet    Sig: Please take 50 mg (5 tablets) by mouth today, then decrease by 1 tablet daily    Dispense:  15 tablet    Refill:  0    Tommi Rumps, MD [place]

## 2017-02-24 NOTE — Progress Notes (Signed)
Pre visit review using our clinic review tool, if applicable. No additional management support is needed unless otherwise documented below in the visit note. 

## 2017-02-25 DIAGNOSIS — M1712 Unilateral primary osteoarthritis, left knee: Secondary | ICD-10-CM | POA: Diagnosis not present

## 2017-03-04 DIAGNOSIS — M1712 Unilateral primary osteoarthritis, left knee: Secondary | ICD-10-CM | POA: Diagnosis not present

## 2017-03-12 DIAGNOSIS — M1712 Unilateral primary osteoarthritis, left knee: Secondary | ICD-10-CM | POA: Diagnosis not present

## 2017-03-16 ENCOUNTER — Other Ambulatory Visit: Payer: Self-pay | Admitting: Internal Medicine

## 2017-03-17 DIAGNOSIS — H04123 Dry eye syndrome of bilateral lacrimal glands: Secondary | ICD-10-CM | POA: Diagnosis not present

## 2017-03-17 DIAGNOSIS — H52223 Regular astigmatism, bilateral: Secondary | ICD-10-CM | POA: Diagnosis not present

## 2017-03-17 DIAGNOSIS — H5213 Myopia, bilateral: Secondary | ICD-10-CM | POA: Diagnosis not present

## 2017-03-17 DIAGNOSIS — H524 Presbyopia: Secondary | ICD-10-CM | POA: Diagnosis not present

## 2017-03-17 DIAGNOSIS — H16223 Keratoconjunctivitis sicca, not specified as Sjogren's, bilateral: Secondary | ICD-10-CM | POA: Diagnosis not present

## 2017-03-17 NOTE — Telephone Encounter (Signed)
Overdue CPE letter mailed 

## 2017-03-19 ENCOUNTER — Ambulatory Visit: Payer: Self-pay | Admitting: Physician Assistant

## 2017-03-19 VITALS — BP 130/85 | HR 80

## 2017-03-19 DIAGNOSIS — N390 Urinary tract infection, site not specified: Secondary | ICD-10-CM

## 2017-03-19 DIAGNOSIS — R319 Hematuria, unspecified: Principal | ICD-10-CM

## 2017-03-19 MED ORDER — CIPROFLOXACIN HCL 250 MG PO TABS: 250.0000 mg | ORAL_TABLET | Freq: Two times a day (BID) | ORAL | 0 refills | Status: DC

## 2017-03-19 MED ORDER — PHENAZOPYRIDINE HCL 200 MG PO TABS: 200.0000 mg | ORAL_TABLET | Freq: Three times a day (TID) | ORAL | 0 refills | Status: DC | PRN

## 2017-03-19 NOTE — Progress Notes (Signed)
S:  C/o uti sx for 2 days, burning, urgency, frequency, denies vaginal discharge, abdominal pain or flank pain:  Remainder ros neg  O:  Vitals wnl, nad, no cva tenderness, back nontender, ua 2+ leuks, 1+ blood A: uti  P: cipro 250mg  bid x 7d, increase water intake, add cranberry juice, return if not improving in 2 -3 days, return earlier if worsening, discussed pyelonephritis sx

## 2017-03-26 LAB — POCT URINALYSIS DIPSTICK
BILIRUBIN UA: NEGATIVE
Glucose, UA: NEGATIVE
KETONES UA: NEGATIVE
Nitrite, UA: NEGATIVE
PROTEIN UA: NEGATIVE
SPEC GRAV UA: 1.015 (ref 1.010–1.025)
Urobilinogen, UA: 0.2 E.U./dL
pH, UA: 6.5 (ref 5.0–8.0)

## 2017-03-26 NOTE — Addendum Note (Signed)
Addended by: Vassie Loll D on: 03/26/2017 04:07 PM   Modules accepted: Orders

## 2017-04-16 DIAGNOSIS — F332 Major depressive disorder, recurrent severe without psychotic features: Secondary | ICD-10-CM | POA: Diagnosis not present

## 2017-04-20 DIAGNOSIS — G4733 Obstructive sleep apnea (adult) (pediatric): Secondary | ICD-10-CM | POA: Diagnosis not present

## 2017-04-24 ENCOUNTER — Telehealth: Payer: Self-pay | Admitting: Internal Medicine

## 2017-04-24 DIAGNOSIS — G4733 Obstructive sleep apnea (adult) (pediatric): Secondary | ICD-10-CM

## 2017-04-24 NOTE — Telephone Encounter (Signed)
Patient is needing portable cpap machine not oxygen - she states that she has already ordered it online and the company will be faxing request for cpap settings - pt can be reached at [phone] -pr

## 2017-04-24 NOTE — Telephone Encounter (Signed)
Called and lmomtcb x 1 for the pt.   

## 2017-04-24 NOTE — Telephone Encounter (Signed)
Spoke with patient with CY recs. Printed out CPAP RX and CY has signed it. Patient is aware. Will fax RX to [email or web address] for patient. Patient will stop by the office to pickup prescription just in case it does not go through from our office. Patient is aware. Nothing further is needed.

## 2017-04-24 NOTE — Telephone Encounter (Signed)
Patient returned call, CB is [phone]

## 2017-04-24 NOTE — Telephone Encounter (Signed)
Spoke with patient regarding CPAP purchase. She stated that she recently bought a Resmed AirMini CPAP and needs a prescription so that the company will know how to program the machine.   Advised patient that she has not been seen since 12/28/2014 and we normally require OSA patients to follow up at least once a year.   Will look up front for the RX from [employer].   Printed out a recent copy of her Cpap download.   CY, please advise if she needs to be seen before the RX can be sent. Thanks!

## 2017-04-24 NOTE — Telephone Encounter (Signed)
Printed Prescription would be for CPAP machine of choice, auto 10-15, mask of choice, humidifier, supplies     Dx OSA  This presumes she has been using CPAP regularly with no break in therapy, so it is continuation of care. If she has not been using CPAP, then her insurance may not pay for this unless we see her before the prescription.  If she plans self-pay, then it doesn't matter.

## 2017-04-28 DIAGNOSIS — G4733 Obstructive sleep apnea (adult) (pediatric): Secondary | ICD-10-CM | POA: Diagnosis not present

## 2017-05-01 ENCOUNTER — Ambulatory Visit (INDEPENDENT_AMBULATORY_CARE_PROVIDER_SITE_OTHER): Payer: 59 | Admitting: Family Medicine

## 2017-05-01 ENCOUNTER — Encounter: Payer: Self-pay | Admitting: Family Medicine

## 2017-05-01 VITALS — BP 131/89 | HR 97 | Temp 98.5°F | Ht 69.0 in | Wt 216.0 lb

## 2017-05-01 DIAGNOSIS — B029 Zoster without complications: Secondary | ICD-10-CM | POA: Diagnosis not present

## 2017-05-01 MED ORDER — VALACYCLOVIR HCL 1 G PO TABS: 1000.0000 mg | ORAL_TABLET | Freq: Three times a day (TID) | ORAL | 0 refills | Status: DC

## 2017-05-01 NOTE — Patient Instructions (Signed)
WE NOW OFFER    [facility] FAST TRACK!!!  SAME DAY Appointments for ACUTE CARE  Such as: Sprains, Injuries, cuts, abrasions, rashes, muscle pain, joint pain, back pain Colds, flu, sore throats, headache, allergies, cough, fever  Ear pain, sinus and eye infections Abdominal pain, nausea, vomiting, diarrhea, upset stomach Animal/insect bites  3 Easy Ways to Schedule: Walk-In Scheduling Call in scheduling Mychart Sign-up: [email or web address]         

## 2017-05-01 NOTE — Progress Notes (Signed)
   Subjective:    Patient ID: [name], female    DOB: [birth year], 61 y.o.   MRN: [id number]  HPI Here for suspected shingles. She had a case of mild shingles in the same area about a year ago. Now for the past 3 weeks she has had a patch of red blisters on the right buttock which itch and burn. They seem to be drying up now.    Review of Systems  Constitutional: Negative.   Respiratory: Negative.   Cardiovascular: Negative.   Skin: Positive for rash.       Objective:   Physical Exam  Constitutional: She appears well-developed and well-nourished.  Cardiovascular: Normal rate, regular rhythm, normal heart sounds and intact distal pulses.   Pulmonary/Chest: Effort normal and breath sounds normal.  Skin:  The right buttock has a small area of red excoriated ulcerations consistent with herpetic lesions           Assessment & Plan:  This looks like a herpetic infection, it is difficult to tell if it is simplex or zoster at this point. Try Valtrex 1000 mg.  Alysia Penna, MD

## 2017-05-06 ENCOUNTER — Other Ambulatory Visit: Payer: Self-pay | Admitting: Internal Medicine

## 2017-05-07 ENCOUNTER — Encounter: Payer: Self-pay | Admitting: Internal Medicine

## 2017-05-21 ENCOUNTER — Ambulatory Visit (INDEPENDENT_AMBULATORY_CARE_PROVIDER_SITE_OTHER): Payer: 59 | Admitting: Internal Medicine

## 2017-05-21 ENCOUNTER — Encounter: Payer: Self-pay | Admitting: Internal Medicine

## 2017-05-21 VITALS — BP 126/86 | HR 65 | Temp 98.1°F | Ht 67.5 in | Wt 215.5 lb

## 2017-05-21 DIAGNOSIS — F32A Depression, unspecified: Secondary | ICD-10-CM

## 2017-05-21 DIAGNOSIS — J301 Allergic rhinitis due to pollen: Secondary | ICD-10-CM

## 2017-05-21 DIAGNOSIS — Z114 Encounter for screening for human immunodeficiency virus [HIV]: Secondary | ICD-10-CM

## 2017-05-21 DIAGNOSIS — Z0000 Encounter for general adult medical examination without abnormal findings: Secondary | ICD-10-CM | POA: Diagnosis not present

## 2017-05-21 DIAGNOSIS — Z01811 Encounter for preprocedural respiratory examination: Secondary | ICD-10-CM | POA: Diagnosis not present

## 2017-05-21 DIAGNOSIS — F419 Anxiety disorder, unspecified: Secondary | ICD-10-CM | POA: Diagnosis not present

## 2017-05-21 DIAGNOSIS — G4733 Obstructive sleep apnea (adult) (pediatric): Secondary | ICD-10-CM

## 2017-05-21 DIAGNOSIS — J4530 Mild persistent asthma, uncomplicated: Secondary | ICD-10-CM | POA: Diagnosis not present

## 2017-05-21 DIAGNOSIS — Z0001 Encounter for general adult medical examination with abnormal findings: Secondary | ICD-10-CM

## 2017-05-21 DIAGNOSIS — Z1159 Encounter for screening for other viral diseases: Secondary | ICD-10-CM

## 2017-05-21 DIAGNOSIS — F329 Major depressive disorder, single episode, unspecified: Secondary | ICD-10-CM

## 2017-05-21 DIAGNOSIS — G43839 Menstrual migraine, intractable, without status migrainosus: Secondary | ICD-10-CM | POA: Diagnosis not present

## 2017-05-21 DIAGNOSIS — K219 Gastro-esophageal reflux disease without esophagitis: Secondary | ICD-10-CM | POA: Diagnosis not present

## 2017-05-21 DIAGNOSIS — M1710 Unilateral primary osteoarthritis, unspecified knee: Secondary | ICD-10-CM

## 2017-05-21 LAB — LIPID PANEL
Cholesterol: 219 mg/dL — ABNORMAL HIGH (ref 0–200)
HDL: 66.9 mg/dL (ref >39.00)
LDL CALC: 127 mg/dL — AB (ref 0–99)
NONHDL: 152.1
Total CHOL/HDL Ratio: 3
Triglycerides: 127 mg/dL (ref 0.0–149.0)
VLDL: 25.4 mg/dL (ref 0.0–40.0)

## 2017-05-21 LAB — COMPREHENSIVE METABOLIC PANEL
ALT: 17 U/L (ref 0–35)
AST: 13 U/L (ref 0–37)
Albumin: 4.4 g/dL (ref 3.5–5.2)
Alkaline Phosphatase: 89 U/L (ref 39–117)
BUN: 15 mg/dL (ref 6–23)
CO2: 27 mEq/L (ref 19–32)
Calcium: 10 mg/dL (ref 8.4–10.5)
Chloride: 107 mEq/L (ref 96–112)
Creatinine, Ser: 0.73 mg/dL (ref 0.40–1.20)
GFR: 86.67 mL/min (ref >60.00)
GLUCOSE: 90 mg/dL (ref 70–99)
POTASSIUM: 4.1 meq/L (ref 3.5–5.1)
SODIUM: 140 meq/L (ref 135–145)
Total Bilirubin: 0.3 mg/dL (ref 0.2–1.2)
Total Protein: 6.8 g/dL (ref 6.0–8.3)

## 2017-05-21 LAB — CBC
HEMATOCRIT: 41.7 % (ref 36.0–46.0)
Hemoglobin: 13.8 g/dL (ref 12.0–15.0)
MCHC: 33 g/dL (ref 30.0–36.0)
MCV: 88.2 fl (ref 78.0–100.0)
Platelets: 293 10*3/uL (ref 150.0–400.0)
RBC: 4.73 Mil/uL (ref 3.87–5.11)
RDW: 13.9 % (ref 11.5–15.5)
WBC: 8.8 10*3/uL (ref 4.0–10.5)

## 2017-05-21 LAB — VITAMIN D 25 HYDROXY (VIT D DEFICIENCY, FRACTURES): VITD: 38.5 ng/mL (ref 30.00–100.00)

## 2017-05-21 NOTE — Assessment & Plan Note (Signed)
Avoid foods that trigger your reflux Encouraged weight loss Continue Prilosec for now

## 2017-05-21 NOTE — Assessment & Plan Note (Signed)
Continue Allegra as needed

## 2017-05-21 NOTE — Assessment & Plan Note (Signed)
Since she is postmenopausal, she wants to stop Topomax but wants to wait after her knee surgery Will continue Topomax and Maxalt for now

## 2017-05-21 NOTE — Progress Notes (Signed)
Subjective:    Patient ID: [name], female    DOB: July 14, 1956, 61 y.o.   MRN: [id number]  HPI  Pt presents to the clinic today for her annual exam. She is also due to follow up chronic conditions.  Seasonal Allergies: Worse in the spring and fall. She takes Allegra as needed with good relief.  Anxiety and Depression: Triggered by Abilify, Pristiq and Klonopin as prescribed. She follows with her psychiatrist, Dr. Silvio Pate.   Arthritis: Mainly in her knees. She is having a left knee replacement in September by Dr. Noemi Chapel. She is taking Celebrex daily as prescribed.  Asthma: Mild, persistent. She takes Advair daily. She uses her Albuterol inhaler very rarely.  GERD: Triggered by spicy, greasy foods. She denies breakthrough symptoms on Prilosec.  Migraines: These occur about 1 x month. She takes Topomax daily for prevention. She has to use the Maxalt when she has breakthrough headahces.  OSA: Her last sleep study was 08/2014. She averages 6-7 hours of sleep with the use of her CPAP. She feels rested when she wakes up.  Flu: 08/2015 Tetanus: 08/2014 Pap Smear: 2015 Mammogram: 05/2016 Colon Screening: 2010, repeat 10 years Vision Screening: annually, 03/2017 Dentist: biannually  Diet: She does eat meat. She consumes fruits and veggies. She tries to avoid fried foods. She drinks mostly water. Exercise: She has been swimming laps in the pool, 2 days a week.  Review of Systems      Past Medical History:  Diagnosis Date  . Acute medial meniscus tear of left knee   . Allergic rhinitis   . Angioedema 1999  . Anxiety   . Arthritis   . Asthma   . Depression   . GERD (gastroesophageal reflux disease)   . Headache    migraines  . Sleep apnea    uses CPAP nightly  . Urticaria 1999    Current Outpatient Prescriptions  Medication Sig Dispense Refill  . ADVAIR DISKUS 100-50 MCG/DOSE AEPB INHALE 1 PUFF INTO THE LUNGS 2 TIMES DAILY. RINSE MOUTH 180 each 3  . Albuterol Sulfate  (PROAIR RESPICLICK) 732 (90 Base) MCG/ACT AEPB Inhale 2 puffs into the lungs 4 (four) times daily as needed. 1 each 0  . ARIPiprazole (ABILIFY) 2 MG tablet Take 2 mg by mouth daily.    . celecoxib (CELEBREX) 100 MG capsule TAKE 1 CAPSULE BY MOUTH 2 TIMES DAILY. MUST SCHEDULE ANNUAL EXAM FOR MORE REFILLS 180 capsule 0  . ciprofloxacin (CIPRO) 250 MG tablet Take 1 tablet (250 mg total) by mouth 2 (two) times daily. (Patient not taking: Reported on 05/01/2017) 14 tablet 0  . clonazePAM (KLONOPIN) 1 MG tablet Take 1 mg by mouth 2 (two) times daily.    Marland Kitchen desvenlafaxine (PRISTIQ) 100 MG 24 hr tablet Take 100 mg by mouth daily.    . fexofenadine (ALLEGRA) 180 MG tablet Take 180 mg by mouth daily.      Marland Kitchen omeprazole (PRILOSEC) 20 MG capsule Take 1 capsule (20 mg total) by mouth daily. 90 capsule 0  . ondansetron (ZOFRAN) 4 MG tablet Take 1 tablet (4 mg total) by mouth every 8 (eight) hours as needed for nausea or vomiting. (Patient not taking: Reported on 05/01/2017) 20 tablet 1  . phenazopyridine (PYRIDIUM) 200 MG tablet Take 1 tablet (200 mg total) by mouth 3 (three) times daily as needed for pain. (Patient not taking: Reported on 05/01/2017) 10 tablet 0  . Probiotic Product (PROBIOTIC ADVANCED PO) Take 1 tablet by mouth daily.    Marland Kitchen  rizatriptan (MAXALT) 10 MG tablet Take 1 tablet (10 mg total) by mouth as needed for migraine. Take 1 by mouth 1-2 times a week migraine onset 30 tablet 1  . topiramate (TOPAMAX) 100 MG tablet TAKE 2 TABLETS BY MOUTH AT BEDTIME. 180 tablet 0  . valACYclovir (VALTREX) 1000 MG tablet Take 1 tablet (1,000 mg total) by mouth 3 (three) times daily. 30 tablet 0   No current facility-administered medications for this visit.     No Known Allergies  Family History  Problem Relation Age of Onset  . COPD Father   . Arthritis Father   . Emphysema Father   . Sleep apnea Father   . Heart disease Mother   . COPD Unknown        sibling   . Arthritis Unknown        sibling    Social  History   Social History  . Marital status: Married    Spouse name: N/A  . Number of children: N/A  . Years of education: N/A   Occupational History  . RN    Social History Main Topics  . Smoking status: Never Smoker  . Smokeless tobacco: Never Used  . Alcohol use 1.2 oz/week    2 Glasses of wine per week     Comment: social  . Drug use: No  . Sexual activity: Yes    Birth control/ protection: Surgical, Post-menopausal   Other Topics Concern  . Not on file   Social History Narrative   Positive history of passive tobacco smoke exposure.           Constitutional: Denies fever, malaise, fatigue, headache or abrupt weight changes.  HEENT: Denies eye pain, eye redness, ear pain, ringing in the ears, wax buildup, runny nose, nasal congestion, bloody nose, or sore throat. Respiratory: Denies difficulty breathing, shortness of breath, cough or sputum production.   Cardiovascular: Pt reports swelling in her legs. Denies chest pain, chest tightness, palpitations or swelling in the hands.  Gastrointestinal: Denies abdominal pain, bloating, constipation, diarrhea or blood in the stool.  GU: Denies urgency, frequency, pain with urination, burning sensation, blood in urine, odor or discharge. Musculoskeletal: Pt reports bilateral knee pain. Denies decrease in range of motion, difficulty with gait, muscle pain or joint swelling.  Skin: Denies redness, rashes, lesions or ulcercations.  Neurological: Denies dizziness, difficulty with memory, difficulty with speech or problems with balance and coordination.  Psych: Pt has history of anxiety and depression. Denies SI/HI.  No other specific complaints in a complete review of systems (except as listed in HPI above).  Objective:   Physical Exam   BP 126/86   Pulse 65   Temp 98.1 F (36.7 C) (Oral)   Ht 5' 7.5" (1.715 m)   Wt 215 lb 8 oz (97.8 kg)   LMP 12/17/2011   SpO2 98%   BMI 33.25 kg/m   Wt Readings from Last 3 Encounters:    05/21/17 215 lb 8 oz (97.8 kg)  05/01/17 216 lb (98 kg)  02/24/17 212 lb 9.6 oz (96.4 kg)    General: Appears her stated age, obese in NAD. Skin: Warm, dry and intact.  HEENT: Head: normal shape and size; Eyes: sclera white, no icterus, conjunctiva pink, PERRLA and EOMs intact; Ears: Tm's gray and intact, normal light reflex; Throat/Mouth: Teeth present, mucosa pink and moist, no exudate, lesions or ulcerations noted.  Neck:  Neck supple, trachea midline. No masses, lumps or thyromegaly present.  Cardiovascular: Normal rate and  rhythm. S1,S2 noted.  No murmur, rubs or gallops noted. Trace BLE edema. No carotid bruits noted. Pulmonary/Chest: Normal effort and positive vesicular breath sounds. No respiratory distress. No wheezes, rales or ronchi noted.  Abdomen: Soft and nontender. Normal bowel sounds. Ventral hernia noted.  Musculoskeletal: Strength 5/5 BUE/BLE. No difficulty with gait.  Neurological: Alert and oriented. Cranial nerves II-XII grossly intact. Coordination normal.  Psychiatric: Mood and affect normal. Behavior is normal. Judgment and thought content normal.    BMET    Component Value Date/Time   NA 134 (L) 07/16/2016 0758   K 3.5 07/16/2016 0758   CL 105 07/16/2016 0758   CO2 21 (L) 07/16/2016 0758   GLUCOSE 152 (H) 07/16/2016 0758   BUN 23 (H) 07/16/2016 0758   CREATININE 0.93 07/16/2016 0758   CREATININE 0.93 06/15/2015 1620   CALCIUM 9.2 07/16/2016 0758   GFRNONAA >60 07/16/2016 0758   GFRAA >60 07/16/2016 0758    Lipid Panel     Component Value Date/Time   CHOL 177 09/27/2015 1510   TRIG 171.0 (H) 09/27/2015 1510   HDL 52.40 09/27/2015 1510   CHOLHDL 3 09/27/2015 1510   VLDL 34.2 09/27/2015 1510   LDLCALC 90 09/27/2015 1510    CBC    Component Value Date/Time   WBC 17.5 (H) 07/16/2016 0758   RBC 5.60 (H) 07/16/2016 0758   HGB 15.8 07/16/2016 0758   HCT 47.7 (H) 07/16/2016 0758   PLT 274 07/16/2016 0758   MCV 85.3 07/16/2016 0758   MCH 28.2  07/16/2016 0758   MCHC 33.0 07/16/2016 0758   RDW 14.0 07/16/2016 0758   LYMPHSABS 0.6 (L) 07/16/2016 0758   MONOABS 0.7 07/16/2016 0758   EOSABS 0.0 07/16/2016 0758   BASOSABS 0.0 07/16/2016 0758    Hgb A1C Lab Results  Component Value Date   HGBA1C 5.6 09/27/2015           Assessment & Plan:   Preventative Health Maintenance:  Encouraged her to get a flu shot in the fall Tetanus UTD Pap smear due 2020 Mammogram UTD Colon Screening UTD Encouraged her to consume a balanced diet and exercise regimen Advised her to see an eye doctor and dentist annually Will check CBC, CMET, Lipid, HIV and Hep C today ECG: no acute findings, normal.  RTC in 1 year, sooner if needed Webb Silversmith, NP

## 2017-05-21 NOTE — Assessment & Plan Note (Signed)
Continue Advair daily and Albuterol prn She will continue to follow with Dr. Annamaria Boots

## 2017-05-21 NOTE — Assessment & Plan Note (Signed)
Chronic but stable on current regimen. She will continue to follow with Dr. Silvio Pate

## 2017-05-21 NOTE — Assessment & Plan Note (Signed)
Encouraged weight loss She will continue to wear her CPAP at night

## 2017-05-21 NOTE — Patient Instructions (Signed)

## 2017-05-21 NOTE — Assessment & Plan Note (Signed)
Pending left knee replacement She will continue Celebrex for now CMET today

## 2017-05-22 LAB — HEPATITIS C ANTIBODY: HCV Ab: NEGATIVE

## 2017-05-22 LAB — HIV ANTIBODY (ROUTINE TESTING W REFLEX): HIV 1&2 Ab, 4th Generation: NONREACTIVE

## 2017-06-04 ENCOUNTER — Ambulatory Visit (INDEPENDENT_AMBULATORY_CARE_PROVIDER_SITE_OTHER): Payer: 59 | Admitting: Family Medicine

## 2017-06-04 ENCOUNTER — Encounter: Payer: Self-pay | Admitting: Family Medicine

## 2017-06-04 ENCOUNTER — Encounter: Payer: Self-pay | Admitting: *Deleted

## 2017-06-04 VITALS — BP 110/76 | HR 78 | Temp 98.7°F | Wt 213.5 lb

## 2017-06-04 DIAGNOSIS — J209 Acute bronchitis, unspecified: Secondary | ICD-10-CM | POA: Diagnosis not present

## 2017-06-04 DIAGNOSIS — J4530 Mild persistent asthma, uncomplicated: Secondary | ICD-10-CM | POA: Diagnosis not present

## 2017-06-04 MED ORDER — AZITHROMYCIN 250 MG PO TABS: ORAL_TABLET | ORAL | 0 refills | Status: DC

## 2017-06-04 MED ORDER — GUAIFENESIN-CODEINE 100-10 MG/5ML PO SYRP: 5.0000 mL | ORAL_SOLUTION | Freq: Two times a day (BID) | ORAL | 0 refills | Status: DC | PRN

## 2017-06-04 NOTE — Assessment & Plan Note (Addendum)
Anticipate viral. Discussed supportive care - continue scheduled albuterol for next 3 days, cheratussin, ibuprofen, fluids and rest. Out of work x 2 days. Given asthma hx, WASP for zpack provided today with indications on when to fill. Pt agrees with plan. No current asthma flare at this time

## 2017-06-04 NOTE — Progress Notes (Signed)
BP 110/76 (BP Location: Left Arm, Patient Position: Sitting, Cuff Size: Normal)   Pulse 78   Temp 98.7 F (37.1 C) (Oral)   Wt 213 lb 8 oz (96.8 kg)   LMP 12/17/2011   SpO2 97%   BMI 32.95 kg/m    CC: cough with sore throat Subjective:    Patient ID: [name], female    DOB: 02/16/1956, 61 y.o.   MRN: [id number]  HPI: RONELLE MICHIE is a 61 y.o. female presenting on 06/04/2017 for Cough and Sore Throat   2d h/o sore throat, mild dyspnea and now cough (dry). Trouble sleeping last night due to cough. Tends to get bronchitis for this.   No fevers/chills, ear or tooth pain, congestion, wheezing.   Last bronchitis was 3 months ago. Treated with augmentin antibiotic.   Known asthma on advair daily and rare albuterol Known allergic rhinitis, GERD, OSA.  No sick contacts at home  No smokers at home.   Relevant past medical, surgical, family and social history reviewed and updated as indicated. Interim medical history since our last visit reviewed. Allergies and medications reviewed and updated. Outpatient Medications Prior to Visit  Medication Sig Dispense Refill  . ADVAIR DISKUS 100-50 MCG/DOSE AEPB INHALE 1 PUFF INTO THE LUNGS 2 TIMES DAILY. RINSE MOUTH 180 each 3  . Albuterol Sulfate (PROAIR RESPICLICK) 409 (90 Base) MCG/ACT AEPB Inhale 2 puffs into the lungs 4 (four) times daily as needed. 1 each 0  . ARIPiprazole (ABILIFY) 2 MG tablet Take 2 mg by mouth daily.    . celecoxib (CELEBREX) 100 MG capsule TAKE 1 CAPSULE BY MOUTH 2 TIMES DAILY. MUST SCHEDULE ANNUAL EXAM FOR MORE REFILLS 180 capsule 0  . clonazePAM (KLONOPIN) 1 MG tablet Take 1 mg by mouth 2 (two) times daily.    Marland Kitchen desvenlafaxine (PRISTIQ) 100 MG 24 hr tablet Take 100 mg by mouth daily.    . fexofenadine (ALLEGRA) 180 MG tablet Take 180 mg by mouth daily.      Marland Kitchen omeprazole (PRILOSEC) 20 MG capsule Take 1 capsule (20 mg total) by mouth daily. 90 capsule 0  . Probiotic Product (PROBIOTIC ADVANCED PO) Take 1  tablet by mouth daily.    . rizatriptan (MAXALT) 10 MG tablet Take 1 tablet (10 mg total) by mouth as needed for migraine. Take 1 by mouth 1-2 times a week migraine onset 30 tablet 1  . topiramate (TOPAMAX) 100 MG tablet TAKE 2 TABLETS BY MOUTH AT BEDTIME. 180 tablet 0   No facility-administered medications prior to visit.      Per HPI unless specifically indicated in ROS section below Review of Systems     Objective:    BP 110/76 (BP Location: Left Arm, Patient Position: Sitting, Cuff Size: Normal)   Pulse 78   Temp 98.7 F (37.1 C) (Oral)   Wt 213 lb 8 oz (96.8 kg)   LMP 12/17/2011   SpO2 97%   BMI 32.95 kg/m   Wt Readings from Last 3 Encounters:  06/04/17 213 lb 8 oz (96.8 kg)  05/21/17 215 lb 8 oz (97.8 kg)  05/01/17 216 lb (98 kg)    Physical Exam  Constitutional: She appears well-developed and well-nourished. No distress.  HENT:  Head: Normocephalic and atraumatic.  Right Ear: Hearing, tympanic membrane, external ear and ear canal normal.  Left Ear: Hearing, tympanic membrane, external ear and ear canal normal.  Nose: No mucosal edema or rhinorrhea. Right sinus exhibits no maxillary sinus tenderness and no frontal  sinus tenderness. Left sinus exhibits no maxillary sinus tenderness and no frontal sinus tenderness.  Mouth/Throat: Uvula is midline, oropharynx is clear and moist and mucous membranes are normal. No oropharyngeal exudate, posterior oropharyngeal edema, posterior oropharyngeal erythema or tonsillar abscesses.  Eyes: Conjunctivae and EOM are normal. Pupils are equal, round, and reactive to light. No scleral icterus.  Neck: Normal range of motion. Neck supple.  Cardiovascular: Normal rate, regular rhythm, normal heart sounds and intact distal pulses.   No murmur heard. Pulmonary/Chest: Effort normal and breath sounds normal. No respiratory distress. She has no wheezes. She has no rales.  Dry harsh cough present  Lymphadenopathy:    She has no cervical  adenopathy.  Skin: Skin is warm and dry. No rash noted.  Nursing note and vitals reviewed.     Assessment & Plan:   Problem List Items Addressed This Visit    Acute bronchitis - Primary    Anticipate viral. Discussed supportive care - continue scheduled albuterol for next 3 days, cheratussin, ibuprofen, fluids and rest. Out of work x 2 days. Given asthma hx, WASP for zpack provided today with indications on when to fill. Pt agrees with plan. No current asthma flare at this time       Asthma, mild persistent       Follow up plan: Return if symptoms worsen or fail to improve.  Ria Bush, MD

## 2017-06-04 NOTE — Patient Instructions (Signed)
I do think you have upper respiratory infection. Push fluids and rest. May use ibuprofen 400-600mg  with meals for bronchial inflammation  May use cheratussin for cough as needed. If fever >101, worsening productive cough, or deterioration after initial improvement, fill antibiotic.  Let us know if not improving with treatment.

## 2017-06-19 ENCOUNTER — Other Ambulatory Visit: Payer: Self-pay | Admitting: Internal Medicine

## 2017-06-19 DIAGNOSIS — Z1231 Encounter for screening mammogram for malignant neoplasm of breast: Secondary | ICD-10-CM

## 2017-06-22 ENCOUNTER — Other Ambulatory Visit: Payer: Self-pay | Admitting: Internal Medicine

## 2017-07-07 ENCOUNTER — Encounter: Payer: Self-pay | Admitting: Internal Medicine

## 2017-07-20 ENCOUNTER — Other Ambulatory Visit: Payer: Self-pay | Admitting: Internal Medicine

## 2017-07-20 DIAGNOSIS — G43839 Menstrual migraine, intractable, without status migrainosus: Secondary | ICD-10-CM

## 2017-07-22 DIAGNOSIS — F332 Major depressive disorder, recurrent severe without psychotic features: Secondary | ICD-10-CM | POA: Diagnosis not present

## 2017-08-06 ENCOUNTER — Inpatient Hospital Stay (HOSPITAL_COMMUNITY): Admission: RE | Admit: 2017-08-06 | Payer: Self-pay | Source: Ambulatory Visit

## 2017-08-07 DIAGNOSIS — G4733 Obstructive sleep apnea (adult) (pediatric): Secondary | ICD-10-CM | POA: Diagnosis not present

## 2017-09-10 DIAGNOSIS — M1712 Unilateral primary osteoarthritis, left knee: Secondary | ICD-10-CM | POA: Diagnosis not present

## 2017-09-22 ENCOUNTER — Other Ambulatory Visit: Payer: Self-pay | Admitting: Internal Medicine

## 2017-09-30 ENCOUNTER — Encounter (HOSPITAL_COMMUNITY): Payer: Self-pay | Admitting: Physician Assistant

## 2017-09-30 DIAGNOSIS — M1712 Unilateral primary osteoarthritis, left knee: Secondary | ICD-10-CM | POA: Diagnosis not present

## 2017-09-30 NOTE — H&P (Signed)
TOTAL KNEE ADMISSION H&P  Patient is being admitted for left total knee arthroplasty.  Subjective:  Chief Complaint:left knee pain.  HPI: [name], 61 y.o. female, has a history of pain and functional disability in the left knee due to arthritis and has failed non-surgical conservative treatments for greater than 12 weeks to includeNSAID's and/or analgesics, corticosteriod injections, viscosupplementation injections, flexibility and strengthening excercises, supervised PT with diminished ADL's post treatment, use of assistive devices and activity modification.  Onset of symptoms was gradual, starting 10 years ago with gradually worsening course since that time. The patient noted prior procedures on the knee to include  arthroscopy and menisectomy on the left knee(s).  Patient currently rates pain in the left knee(s) at 10 out of 10 with activity. Patient has night pain, worsening of pain with activity and weight bearing, pain that interferes with activities of daily living, crepitus and joint swelling.  Patient has evidence of subchondral sclerosis, periarticular osteophytes and joint space narrowing by imaging studies.  There is no active infection.  Patient Active Problem List   Diagnosis Date Noted  . Primary localized osteoarthritis of left knee   . Acute bronchitis 06/04/2017  . Arthritis of knee 06/15/2015  . Intractable menstrual migraine without status migrainosus 06/15/2015  . Anxiety and depression 06/15/2015  . Obstructive sleep apnea 08/03/2014  . GERD 05/14/2010  . Seasonal allergies 09/25/2007  . Asthma, mild persistent 09/25/2007   Past Medical History:  Diagnosis Date  . Acute medial meniscus tear of left knee   . Allergic rhinitis   . Angioedema 1999  . Anxiety   . Arthritis   . Asthma   . Depression   . GERD (gastroesophageal reflux disease)   . Headache    migraines  . Primary localized osteoarthritis of left knee   . Sleep apnea    uses CPAP nightly  .  Urticaria 1999    Past Surgical History:  Procedure Laterality Date  . AUGMENTATION MAMMAPLASTY Bilateral 1996  . BACK SURGERY  1999   lumb tumor  . BREAST EXCISIONAL BIOPSY Right 1990's   benign lymph node removal  . BUNIONECTOMY  2010  . CESAREAN SECTION    . CHEILECTOMY  10/28/2011   Procedure: CHEILECTOMY;  Surgeon: Lorn Junes, MD;  Location: [facility];  Service: Orthopedics;  Laterality: Right;  right great toe hallux rigidus with cheilectomy 1st metatarsophalangeal  . CHONDROPLASTY Left 01/16/2015   Procedure: CHONDROPLASTY;  Surgeon: Lorn Junes, MD;  Location: [facility];  Service: Orthopedics;  Laterality: Left;  . KNEE ARTHROSCOPY WITH MEDIAL MENISECTOMY Left 01/16/2015   Procedure: LEFT KNEE SCOPE MEDIAL MENISECTOMY;  Surgeon: Lorn Junes, MD;  Location: [facility];  Service: Orthopedics;  Laterality: Left;  . LATERAL EPICONDYLE RELEASE  2007   right  . PLACEMENT OF BREAST IMPLANTS    . SHOULDER ARTHROSCOPY W/ ROTATOR CUFF REPAIR  2009  . SHOULDER ARTHROSCOPY W/ SUBACROMIAL DECOMPRESSION AND DISTAL CLAVICLE EXCISION  2011   left  . TONSILLECTOMY    . TUBAL LIGATION      No current facility-administered medications for this encounter.    Current Outpatient Prescriptions  Medication Sig Dispense Refill Last Dose  . ADVAIR DISKUS 100-50 MCG/DOSE AEPB INHALE 1 PUFF INTO THE LUNGS 2 TIMES DAILY. RINSE MOUTH 180 each 3 09/30/2017 at Unknown time  . ARIPiprazole (ABILIFY) 2 MG tablet Take 2 mg by mouth daily.   09/30/2017 at Unknown time  . aspirin EC  81 MG tablet Take 81 mg by mouth daily.   09/30/2017 at Unknown time  . celecoxib (CELEBREX) 100 MG capsule Take 1 capsule (100 mg total) by mouth 2 (two) times daily. 180 capsule 2 09/30/2017 at Unknown time  . Cholecalciferol (VITAMIN D) 2000 units tablet Take 2,000 Units by mouth daily.   09/30/2017 at Unknown time  . clonazePAM (KLONOPIN) 1 MG tablet Take 1 mg by mouth 2  (two) times daily.   09/30/2017 at Unknown time  . desvenlafaxine (PRISTIQ) 100 MG 24 hr tablet Take 100 mg by mouth daily.   09/30/2017 at Unknown time  . fexofenadine (ALLEGRA) 180 MG tablet Take 180 mg by mouth daily.     09/30/2017 at Unknown time  . omeprazole (PRILOSEC) 20 MG capsule TAKE 1 CAPSULE BY MOUTH DAILY 90 capsule 0 09/30/2017 at Unknown time  . Probiotic Product (PROBIOTIC ADVANCED PO) Take 1 tablet by mouth daily.   09/30/2017 at Unknown time  . topiramate (TOPAMAX) 100 MG tablet TAKE 2 TABLETS BY MOUTH AT BEDTIME. 180 tablet 0 09/30/2017 at Unknown time  . Albuterol Sulfate (PROAIR RESPICLICK) 867 (90 Base) MCG/ACT AEPB Inhale 2 puffs into the lungs 4 (four) times daily as needed. (Patient taking differently: Inhale 2 puffs into the lungs 4 (four) times daily as needed (Shortness of breath/wheezing). ) 1 each 0 More than a month at Unknown time   No Known Allergies  Social History  Substance Use Topics  . Smoking status: Never Smoker  . Smokeless tobacco: Never Used  . Alcohol use 1.2 oz/week    2 Glasses of wine per week     Comment: social    Family History  Problem Relation Age of Onset  . COPD Father   . Arthritis Father   . Emphysema Father   . Sleep apnea Father   . Heart disease Mother   . COPD Unknown        sibling   . Arthritis Unknown        sibling     ROS  Objective:  Physical Exam  Vital signs in last 24 hours:    Labs:   Estimated body mass index is 32.95 kg/m as calculated from the following:   Height as of 05/21/17: 5' 7.5" (1.715 m).   Weight as of 06/04/17: 96.8 kg (213 lb 8 oz).   Imaging Review Plain radiographs demonstrate severe degenerative joint disease of the left knee(s). The overall alignment issignificant varus. The bone quality appears to be good for age and reported activity level.  Assessment/Plan:  End stage arthritis, left knee  Principal Problem:   Primary localized osteoarthritis of left knee Active  Problems:   Seasonal allergies   Asthma, mild persistent   GERD   Obstructive sleep apnea   Anxiety and depression   The patient history, physical examination, clinical judgment of the provider and imaging studies are consistent with end stage degenerative joint disease of the left knee(s) and total knee arthroplasty is deemed medically necessary. The treatment options including medical management, injection therapy arthroscopy and arthroplasty were discussed at length. The risks and benefits of total knee arthroplasty were presented and reviewed. The risks due to aseptic loosening, infection, stiffness, patella tracking problems, thromboembolic complications and other imponderables were discussed. The patient acknowledged the explanation, agreed to proceed with the plan and consent was signed. Patient is being admitted for inpatient treatment for surgery, pain control, PT, OT, prophylactic antibiotics, VTE prophylaxis, progressive ambulation and ADL's and discharge planning. The  patient is planning to be discharged home with home health services

## 2017-10-01 NOTE — Progress Notes (Addendum)
JYL:TEIHDT Baity, NP  Cardiologist:pt denies  EKG:05/21/17 in EPIC  Stress test: pt denies ever  ECHO: pt denies ever  Cardiac Cath: pt denies ever  Chest x-ray: denies past year, no recent respiratrory complications/infections

## 2017-10-01 NOTE — Pre-Procedure Instructions (Signed)
[name]  10/01/2017      [place], [place] - [address] [place] [address] Phone: [phone] Fax: [phone]    Your procedure is scheduled on October 12, 2017.  Report to [facility] Admitting at 530 AM.  Call this number if you have problems the morning of surgery:  [phone]   Remember:  Do not eat food or drink liquids after midnight.  Take these medicines the morning of surgery with A SIP OF WATER  advair diskus Albuterol inhaler (bring inhalers with you) Aripiprazole (abilify) Clonazepam (klonopin) desvenlafaxine (pristiq)  fexofenadine (allegra) Omeprazole (prilosec)    7 days prior to surgery STOP taking any celebrex, Aspirin (unless otherwise instructed by your surgeon), Aleve, Naproxen, Ibuprofen, Motrin, Advil, Goody's, BC's, all herbal medications, fish oil, and all vitamins  Continue all other medications as instructed by your physician except follow the above medication instructions before surgery   Do not wear jewelry, make-up or nail polish.  Do not wear lotions, powders, or perfumes, or deoderant.  Do not shave 48 hours prior to surgery.    Do not bring valuables to the hospital.  [facility] is not responsible for any belongings or valuables.  Contacts, dentures or bridgework may not be worn into surgery.  Leave your suitcase in the car.  After surgery it may be brought to your room.  For patients admitted to the hospital, discharge time will be determined by your treatment team.  Patients discharged the day of surgery will not be allowed to drive home.   Special instructions:  [place]- Preparing For Surgery  Before surgery, you can play an important role. Because skin is not sterile, your skin needs to be as free of germs as possible. You can reduce the number of germs on your skin by washing with CHG (chlorahexidine gluconate) Soap before surgery.  CHG  is an antiseptic cleaner which kills germs and bonds with the skin to continue killing germs even after washing.  Please do not use if you have an allergy to CHG or antibacterial soaps. If your skin becomes reddened/irritated stop using the CHG.  Do not shave (including legs and underarms) for at least 48 hours prior to first CHG shower. It is OK to shave your face.  Please follow these instructions carefully.   1. Shower the NIGHT BEFORE SURGERY and the MORNING OF SURGERY with CHG.   2. If you chose to wash your hair, wash your hair first as usual with your normal shampoo.  3. After you shampoo, rinse your hair and body thoroughly to remove the shampoo.  4. Use CHG as you would any other liquid soap. You can apply CHG directly to the skin and wash gently with a scrungie or a clean washcloth.   5. Apply the CHG Soap to your body ONLY FROM THE NECK DOWN.  Do not use on open wounds or open sores. Avoid contact with your eyes, ears, mouth and genitals (private parts). Wash Face and genitals (private parts)  with your normal soap.  6. Wash thoroughly, paying special attention to the area where your surgery will be performed.  7. Thoroughly rinse your body with warm water from the neck down.  8. DO NOT shower/wash with your normal soap after using and rinsing off the CHG Soap.  9. Pat yourself dry with a CLEAN TOWEL.  10. Wear CLEAN PAJAMAS to bed the night before  surgery, wear comfortable clothes the morning of surgery  11. Place CLEAN SHEETS on your bed the night of your first shower and DO NOT SLEEP WITH PETS.    Day of Surgery: Do not apply any deodorants/lotions. Please wear clean clothes to the hospital/surgery center.     Please read over the following fact sheets that you were given. Pain Booklet, Coughing and Deep Breathing, MRSA Information and Surgical Site Infection Prevention

## 2017-10-02 ENCOUNTER — Inpatient Hospital Stay (HOSPITAL_COMMUNITY): Admission: RE | Admit: 2017-10-02 | Payer: Self-pay | Source: Ambulatory Visit

## 2017-10-02 ENCOUNTER — Encounter (HOSPITAL_COMMUNITY): Payer: Self-pay

## 2017-10-02 ENCOUNTER — Encounter (HOSPITAL_COMMUNITY)
Admission: RE | Admit: 2017-10-02 | Discharge: 2017-10-02 | Disposition: A | Discharge status: Home / Self Care | Payer: 59 | Source: Ambulatory Visit | Attending: Orthopedic Surgery | Admitting: Orthopedic Surgery

## 2017-10-02 DIAGNOSIS — Z01818 Encounter for other preprocedural examination: Secondary | ICD-10-CM | POA: Diagnosis not present

## 2017-10-02 DIAGNOSIS — M1712 Unilateral primary osteoarthritis, left knee: Secondary | ICD-10-CM | POA: Insufficient documentation

## 2017-10-02 LAB — CBC WITH DIFFERENTIAL/PLATELET
BASOS PCT: 0 %
Basophils Absolute: 0 10*3/uL (ref 0.0–0.1)
EOS PCT: 0 %
Eosinophils Absolute: 0 10*3/uL (ref 0.0–0.7)
HEMATOCRIT: 40.5 % (ref 36.0–46.0)
Hemoglobin: 13.2 g/dL (ref 12.0–15.0)
Lymphocytes Relative: 25 %
Lymphs Abs: 2.4 10*3/uL (ref 0.7–4.0)
MCH: 29 pg (ref 26.0–34.0)
MCHC: 32.6 g/dL (ref 30.0–36.0)
MCV: 89 fL (ref 78.0–100.0)
MONO ABS: 0.4 10*3/uL (ref 0.1–1.0)
MONOS PCT: 4 %
NEUTROS ABS: 6.7 10*3/uL (ref 1.7–7.7)
Neutrophils Relative %: 71 %
Platelets: 287 10*3/uL (ref 150–400)
RBC: 4.55 MIL/uL (ref 3.87–5.11)
RDW: 13.3 % (ref 11.5–15.5)
WBC: 9.5 10*3/uL (ref 4.0–10.5)

## 2017-10-02 LAB — COMPREHENSIVE METABOLIC PANEL
ALBUMIN: 3.9 g/dL (ref 3.5–5.0)
ALT: 26 U/L (ref 14–54)
ANION GAP: 6 (ref 5–15)
AST: 17 U/L (ref 15–41)
Alkaline Phosphatase: 93 U/L (ref 38–126)
BILIRUBIN TOTAL: 0.6 mg/dL (ref 0.3–1.2)
BUN: 12 mg/dL (ref 6–20)
CO2: 23 mmol/L (ref 22–32)
Calcium: 9.5 mg/dL (ref 8.9–10.3)
Chloride: 109 mmol/L (ref 101–111)
Creatinine, Ser: 0.77 mg/dL (ref 0.44–1.00)
GFR calc Af Amer: >60 mL/min (ref >60)
Glucose, Bld: 100 mg/dL — ABNORMAL HIGH (ref 65–99)
POTASSIUM: 3.9 mmol/L (ref 3.5–5.1)
Sodium: 138 mmol/L (ref 135–145)
TOTAL PROTEIN: 6.6 g/dL (ref 6.5–8.1)

## 2017-10-02 LAB — TYPE AND SCREEN
ABO/RH(D): A POS
ANTIBODY SCREEN: NEGATIVE

## 2017-10-02 LAB — APTT: aPTT: 29 seconds (ref 24–36)

## 2017-10-02 LAB — PROTIME-INR
INR: 1
PROTHROMBIN TIME: 13.1 s (ref 11.4–15.2)

## 2017-10-02 LAB — ABO/RH: ABO/RH(D): A POS

## 2017-10-02 LAB — SURGICAL PCR SCREEN
MRSA, PCR: NEGATIVE
STAPHYLOCOCCUS AUREUS: NEGATIVE

## 2017-10-03 LAB — URINE CULTURE: Culture: 20000 — AB

## 2017-10-08 ENCOUNTER — Other Ambulatory Visit: Payer: Self-pay | Admitting: *Deleted

## 2017-10-08 NOTE — Patient Outreach (Signed)
[facility]) Care Management  10/08/2017  [name] 01-06-1956 [id number]   Subjective: Telephone call to patient's home number, no answer, message states invalid number, and unable to leave a message. Telephone call to patient's home  / mobile number, no answer, left HIPAA compliant voicemail message, and requested call back.    Objective:  Per KPN (Knowledge Performance Now, point of care tool) and chart review, patient to be admitted to [facility] on 10/12/17 for LEFT TOTAL KNEE ARTHROPLASTY.   Patient also has a history of asthma.    Assessment: Received [facility]MR Preoperative / Transition of care referral on 09/29/17.  Preoperative call follow up pending patient contact.     Plan: RNCM will call patient for 2nd telephone outreach attempt, preoperative call follow up, within 10 business days if no return call.     [name] Friendly, BSN, [place] Management [facility] Telephonic CM Phone: [phone] Fax: [phone]

## 2017-10-09 ENCOUNTER — Other Ambulatory Visit: Payer: Self-pay | Admitting: *Deleted

## 2017-10-09 ENCOUNTER — Ambulatory Visit: Payer: Self-pay | Admitting: *Deleted

## 2017-10-09 MED ORDER — TRANEXAMIC ACID 1000 MG/10ML IV SOLN
1000.0000 mg | INTRAVENOUS | Status: AC
  Administered 2017-10-12: 1000 mg via INTRAVENOUS
  Filled 2017-10-09: qty 1100

## 2017-10-09 NOTE — Patient Outreach (Signed)
[facility]) Care Management  10/09/2017  [name] 07/06/1956 [id number]   Subjective: Telephone call to patient's home  / mobile number, no answer, left HIPAA compliant voicemail message, and requested call back.    Objective:  Per KPN (Knowledge Performance Now, point of care tool) and chart review, patient to be admitted to [facility] on 10/12/17 for LEFT TOTAL KNEE ARTHROPLASTY.   Patient also has a history of asthma.    Assessment: Received UMR Preoperative / Transition of care referral on 09/29/17.  Preoperative call follow up pending patient contact.     Plan: RNCM will call patient for 3rd telephone outreach attempt, preoperative call follow up, within 10 business days if no return call.     [name], BSN, [place] Management [facility] Telephonic CM Phone: [phone] Fax: [phone]

## 2017-10-09 NOTE — Patient Outreach (Signed)
[facility]) Care Management  10/09/2017  Lauren LEAVELLE [birth year] [id number]   Subjective: Telephone call from patient's home / mobile number times, spoke with patient times 2, and HIPAA verified.  Discussed [facility] Care Management UMR Transition of care follow up, preoperative call follow up, patient voiced understanding, and is in agreement to both types of follow up.   Patient states she is doing well, ready for surgery on 10/12/17 at [facility], estimated length of stay is 2 nights, planning to have home health physical therapy, and transition to outpatient therapy at a provider in [place].  Patient voices understanding of medical diagnosis, pending surgery, and treatment plan.   States she is aware of her outpatient physical therapy benefit for [facility] facility versus non [facility] facility, and will choose provider in [place] due to close proximity to her residence.  States she is accessing the following [facility] benefits: outpatient pharmacy, hospital indemnity (benefit not chosen), and had been approved for family medical leave act (FMLA).   Patient states she does not have any preoperative questions, care coordination, disease management, disease monitoring, transportation, community resource, or pharmacy needs at this time.    States she is very appreciative of the follow up and is in agreement to receive [place] Management information post transition of care follow up.    Objective:Per KPN (Knowledge Performance Now, point of care tool) and chart review,patient to be admitted to [facility] on 10/12/17 forLEFT TOTAL KNEE ARTHROPLASTY. Patient also has a history of asthma.    Assessment:Received UMR Preoperative / Transition of care referral on 09/29/17. Preoperative call completed, and transition of care follow up pending notification of patient discharge.    Plan:RNCM will call patient for  telephone outreach attempt, transition of care follow up,  within 3 business days of hospital discharge notification.     Isidore Margraf H. Annia Friendly, BSN, [place] Management [facility] Telephonic CM Phone: [phone] Fax: [phone]

## 2017-10-12 ENCOUNTER — Ambulatory Visit: Payer: Self-pay | Admitting: *Deleted

## 2017-10-12 ENCOUNTER — Inpatient Hospital Stay (HOSPITAL_COMMUNITY): Payer: 59 | Admitting: Certified Registered Nurse Anesthetist

## 2017-10-12 ENCOUNTER — Inpatient Hospital Stay (HOSPITAL_COMMUNITY)
Admission: RE | Admit: 2017-10-12 | Discharge: 2017-10-13 | DRG: 470 | Disposition: A | Discharge status: Home Health | Payer: 59 | Source: Ambulatory Visit | Attending: Orthopedic Surgery | Admitting: Orthopedic Surgery

## 2017-10-12 ENCOUNTER — Encounter (HOSPITAL_COMMUNITY): Payer: Self-pay | Admitting: *Deleted

## 2017-10-12 ENCOUNTER — Encounter (HOSPITAL_COMMUNITY)
Admission: RE | Disposition: A | Discharge status: Home Health | Payer: Self-pay | Source: Ambulatory Visit | Attending: Orthopedic Surgery

## 2017-10-12 DIAGNOSIS — K219 Gastro-esophageal reflux disease without esophagitis: Secondary | ICD-10-CM | POA: Diagnosis not present

## 2017-10-12 DIAGNOSIS — J4530 Mild persistent asthma, uncomplicated: Secondary | ICD-10-CM | POA: Diagnosis not present

## 2017-10-12 DIAGNOSIS — G4733 Obstructive sleep apnea (adult) (pediatric): Secondary | ICD-10-CM | POA: Diagnosis present

## 2017-10-12 DIAGNOSIS — E669 Obesity, unspecified: Secondary | ICD-10-CM | POA: Diagnosis present

## 2017-10-12 DIAGNOSIS — Z7982 Long term (current) use of aspirin: Secondary | ICD-10-CM | POA: Diagnosis not present

## 2017-10-12 DIAGNOSIS — Z79899 Other long term (current) drug therapy: Secondary | ICD-10-CM

## 2017-10-12 DIAGNOSIS — F329 Major depressive disorder, single episode, unspecified: Secondary | ICD-10-CM | POA: Diagnosis present

## 2017-10-12 DIAGNOSIS — G8918 Other acute postprocedural pain: Secondary | ICD-10-CM | POA: Diagnosis not present

## 2017-10-12 DIAGNOSIS — F419 Anxiety disorder, unspecified: Secondary | ICD-10-CM | POA: Diagnosis present

## 2017-10-12 DIAGNOSIS — M25562 Pain in left knee: Secondary | ICD-10-CM | POA: Diagnosis present

## 2017-10-12 DIAGNOSIS — Z6833 Body mass index (BMI) 33.0-33.9, adult: Secondary | ICD-10-CM | POA: Diagnosis not present

## 2017-10-12 DIAGNOSIS — Z96652 Presence of left artificial knee joint: Secondary | ICD-10-CM | POA: Diagnosis not present

## 2017-10-12 DIAGNOSIS — M1712 Unilateral primary osteoarthritis, left knee: Principal | ICD-10-CM | POA: Diagnosis present

## 2017-10-12 DIAGNOSIS — J302 Other seasonal allergic rhinitis: Secondary | ICD-10-CM | POA: Diagnosis present

## 2017-10-12 HISTORY — DX: Unilateral primary osteoarthritis, left knee: M17.12

## 2017-10-12 HISTORY — PX: TOTAL KNEE ARTHROPLASTY: SHX125

## 2017-10-12 SURGERY — ARTHROPLASTY, KNEE, TOTAL
Anesthesia: Regional | Site: Knee | Laterality: Left

## 2017-10-12 MED ORDER — MOMETASONE FURO-FORMOTEROL FUM 100-5 MCG/ACT IN AERO
2.0000 | INHALATION_SPRAY | Freq: Two times a day (BID) | RESPIRATORY_TRACT | Status: DC
  Administered 2017-10-12 – 2017-10-13 (×2): 2 via RESPIRATORY_TRACT
  Filled 2017-10-12: qty 8.8

## 2017-10-12 MED ORDER — ACETAMINOPHEN 325 MG PO TABS: 650.0000 mg | ORAL_TABLET | ORAL | Status: DC | PRN

## 2017-10-12 MED ORDER — OXYCODONE HCL 5 MG PO TABS
ORAL_TABLET | ORAL | Status: AC
  Filled 2017-10-12: qty 1

## 2017-10-12 MED ORDER — ROPIVACAINE HCL 5 MG/ML IJ SOLN
INTRAMUSCULAR | Status: DC | PRN
  Administered 2017-10-12: 30 mL via PERINEURAL

## 2017-10-12 MED ORDER — ASPIRIN EC 325 MG PO TBEC
325.0000 mg | DELAYED_RELEASE_TABLET | Freq: Every day | ORAL | Status: DC
  Administered 2017-10-13: 325 mg via ORAL
  Filled 2017-10-12: qty 1

## 2017-10-12 MED ORDER — DEXAMETHASONE SODIUM PHOSPHATE 10 MG/ML IJ SOLN
INTRAMUSCULAR | Status: DC | PRN
  Administered 2017-10-12: 10 mg via INTRAVENOUS

## 2017-10-12 MED ORDER — LACTATED RINGERS IV SOLN
INTRAVENOUS | Status: DC | PRN
  Administered 2017-10-12 (×2): via INTRAVENOUS

## 2017-10-12 MED ORDER — ONDANSETRON HCL 4 MG/2ML IJ SOLN: 4.0000 mg | Freq: Four times a day (QID) | INTRAMUSCULAR | Status: DC | PRN

## 2017-10-12 MED ORDER — POVIDONE-IODINE 7.5 % EX SOLN
Freq: Once | CUTANEOUS | Status: DC
  Filled 2017-10-12: qty 118

## 2017-10-12 MED ORDER — LORATADINE 10 MG PO TABS
10.0000 mg | ORAL_TABLET | Freq: Every day | ORAL | Status: DC
  Administered 2017-10-13: 10 mg via ORAL
  Filled 2017-10-12: qty 1

## 2017-10-12 MED ORDER — ACETAMINOPHEN 500 MG PO TABS
1000.0000 mg | ORAL_TABLET | Freq: Four times a day (QID) | ORAL | Status: AC
  Administered 2017-10-12 (×2): 1000 mg via ORAL
  Filled 2017-10-12 (×3): qty 2

## 2017-10-12 MED ORDER — CHLORHEXIDINE GLUCONATE 4 % EX LIQD: 60.0000 mL | Freq: Once | CUTANEOUS | Status: DC

## 2017-10-12 MED ORDER — DOCUSATE SODIUM 100 MG PO CAPS
100.0000 mg | ORAL_CAPSULE | Freq: Two times a day (BID) | ORAL | Status: DC
  Administered 2017-10-12 – 2017-10-13 (×2): 100 mg via ORAL
  Filled 2017-10-12 (×3): qty 1

## 2017-10-12 MED ORDER — MENTHOL 3 MG MT LOZG: 1.0000 | LOZENGE | OROMUCOSAL | Status: DC | PRN

## 2017-10-12 MED ORDER — SODIUM CHLORIDE 0.9 % IR SOLN
Status: DC | PRN
  Administered 2017-10-12: 1000 mL

## 2017-10-12 MED ORDER — CEFAZOLIN SODIUM-DEXTROSE 2-4 GM/100ML-% IV SOLN
2.0000 g | Freq: Four times a day (QID) | INTRAVENOUS | Status: AC
  Administered 2017-10-12 (×2): 2 g via INTRAVENOUS
  Filled 2017-10-12 (×2): qty 100

## 2017-10-12 MED ORDER — DIPHENHYDRAMINE HCL 12.5 MG/5ML PO ELIX: 12.5000 mg | ORAL_SOLUTION | ORAL | Status: DC | PRN

## 2017-10-12 MED ORDER — CEFAZOLIN SODIUM-DEXTROSE 2-4 GM/100ML-% IV SOLN
2.0000 g | INTRAVENOUS | Status: AC
  Administered 2017-10-12: 2 g via INTRAVENOUS
  Filled 2017-10-12: qty 100

## 2017-10-12 MED ORDER — PROPOFOL 10 MG/ML IV BOLUS
INTRAVENOUS | Status: DC | PRN
  Administered 2017-10-12 (×3): 20 mg via INTRAVENOUS

## 2017-10-12 MED ORDER — LACTATED RINGERS IV SOLN: INTRAVENOUS | Status: DC

## 2017-10-12 MED ORDER — BUPIVACAINE-EPINEPHRINE 0.25% -1:200000 IJ SOLN
INTRAMUSCULAR | Status: DC | PRN
  Administered 2017-10-12: 30 mL

## 2017-10-12 MED ORDER — DEXAMETHASONE SODIUM PHOSPHATE 10 MG/ML IJ SOLN
10.0000 mg | Freq: Three times a day (TID) | INTRAMUSCULAR | Status: DC
  Administered 2017-10-12 – 2017-10-13 (×3): 10 mg via INTRAVENOUS
  Filled 2017-10-12 (×3): qty 1

## 2017-10-12 MED ORDER — VENLAFAXINE HCL ER 150 MG PO CP24
150.0000 mg | ORAL_CAPSULE | Freq: Every day | ORAL | Status: DC
  Administered 2017-10-13: 150 mg via ORAL
  Filled 2017-10-12: qty 1

## 2017-10-12 MED ORDER — ARIPIPRAZOLE 2 MG PO TABS
2.0000 mg | ORAL_TABLET | Freq: Every day | ORAL | Status: DC
  Administered 2017-10-13: 2 mg via ORAL
  Filled 2017-10-12: qty 1

## 2017-10-12 MED ORDER — METOCLOPRAMIDE HCL 5 MG/ML IJ SOLN: 5.0000 mg | Freq: Three times a day (TID) | INTRAMUSCULAR | Status: DC | PRN

## 2017-10-12 MED ORDER — FENTANYL CITRATE (PF) 100 MCG/2ML IJ SOLN
INTRAMUSCULAR | Status: AC
  Filled 2017-10-12: qty 2

## 2017-10-12 MED ORDER — PROPOFOL 500 MG/50ML IV EMUL
INTRAVENOUS | Status: DC | PRN
  Administered 2017-10-12: 100 ug/kg/min via INTRAVENOUS

## 2017-10-12 MED ORDER — ONDANSETRON HCL 4 MG PO TABS: 4.0000 mg | ORAL_TABLET | Freq: Four times a day (QID) | ORAL | Status: DC | PRN

## 2017-10-12 MED ORDER — OXYCODONE HCL 5 MG PO TABS
10.0000 mg | ORAL_TABLET | ORAL | Status: DC | PRN
  Administered 2017-10-12 – 2017-10-13 (×7): 10 mg via ORAL
  Filled 2017-10-12 (×7): qty 2

## 2017-10-12 MED ORDER — VITAMIN D 1000 UNITS PO TABS
2000.0000 [IU] | ORAL_TABLET | Freq: Every day | ORAL | Status: DC
  Administered 2017-10-13: 2000 [IU] via ORAL
  Filled 2017-10-12 (×2): qty 2

## 2017-10-12 MED ORDER — ACETAMINOPHEN 650 MG RE SUPP: 650.0000 mg | RECTAL | Status: DC | PRN

## 2017-10-12 MED ORDER — POTASSIUM CHLORIDE IN NACL 20-0.9 MEQ/L-% IV SOLN
INTRAVENOUS | Status: DC
  Administered 2017-10-12 – 2017-10-13 (×2): via INTRAVENOUS
  Filled 2017-10-12 (×2): qty 1000

## 2017-10-12 MED ORDER — BUPIVACAINE IN DEXTROSE 0.75-8.25 % IT SOLN
INTRATHECAL | Status: DC | PRN
  Administered 2017-10-12: 1.6 mg via INTRATHECAL

## 2017-10-12 MED ORDER — METOCLOPRAMIDE HCL 5 MG PO TABS: 5.0000 mg | ORAL_TABLET | Freq: Three times a day (TID) | ORAL | Status: DC | PRN

## 2017-10-12 MED ORDER — POLYETHYLENE GLYCOL 3350 17 G PO PACK
17.0000 g | PACK | Freq: Two times a day (BID) | ORAL | Status: DC
  Administered 2017-10-12 – 2017-10-13 (×3): 17 g via ORAL
  Filled 2017-10-12 (×3): qty 1

## 2017-10-12 MED ORDER — TOPIRAMATE 100 MG PO TABS
200.0000 mg | ORAL_TABLET | Freq: Every day | ORAL | Status: DC
  Administered 2017-10-12: 200 mg via ORAL
  Filled 2017-10-12: qty 2

## 2017-10-12 MED ORDER — CLONAZEPAM 1 MG PO TABS
1.0000 mg | ORAL_TABLET | Freq: Two times a day (BID) | ORAL | Status: DC
  Administered 2017-10-12 – 2017-10-13 (×2): 1 mg via ORAL
  Filled 2017-10-12 (×2): qty 1

## 2017-10-12 MED ORDER — ONDANSETRON HCL 4 MG/2ML IJ SOLN
INTRAMUSCULAR | Status: DC | PRN
  Administered 2017-10-12: 4 mg via INTRAVENOUS

## 2017-10-12 MED ORDER — ONDANSETRON HCL 4 MG/2ML IJ SOLN: 4.0000 mg | Freq: Once | INTRAMUSCULAR | Status: DC | PRN

## 2017-10-12 MED ORDER — OXYCODONE HCL 5 MG PO TABS
5.0000 mg | ORAL_TABLET | ORAL | Status: DC | PRN
  Administered 2017-10-12: 5 mg via ORAL

## 2017-10-12 MED ORDER — FENTANYL CITRATE (PF) 100 MCG/2ML IJ SOLN
25.0000 ug | INTRAMUSCULAR | Status: DC | PRN
  Administered 2017-10-12 (×2): 50 ug via INTRAVENOUS

## 2017-10-12 MED ORDER — MIDAZOLAM HCL 5 MG/5ML IJ SOLN
INTRAMUSCULAR | Status: DC | PRN
  Administered 2017-10-12: 0.5 mg via INTRAVENOUS
  Administered 2017-10-12 (×2): 1 mg via INTRAVENOUS

## 2017-10-12 MED ORDER — MIDAZOLAM HCL 2 MG/2ML IJ SOLN
INTRAMUSCULAR | Status: AC
  Filled 2017-10-12: qty 2

## 2017-10-12 MED ORDER — HYDROMORPHONE HCL 1 MG/ML IJ SOLN
1.0000 mg | INTRAMUSCULAR | Status: DC | PRN
  Administered 2017-10-12 (×2): 1 mg via INTRAVENOUS
  Filled 2017-10-12 (×2): qty 1

## 2017-10-12 MED ORDER — FENTANYL CITRATE (PF) 100 MCG/2ML IJ SOLN
INTRAMUSCULAR | Status: DC | PRN
  Administered 2017-10-12: 25 ug via INTRAVENOUS
  Administered 2017-10-12: 50 ug via INTRAVENOUS

## 2017-10-12 MED ORDER — PHENOL 1.4 % MT LIQD: 1.0000 | OROMUCOSAL | Status: DC | PRN

## 2017-10-12 MED ORDER — PANTOPRAZOLE SODIUM 40 MG PO TBEC
40.0000 mg | DELAYED_RELEASE_TABLET | Freq: Every day | ORAL | Status: DC
  Administered 2017-10-12 – 2017-10-13 (×2): 40 mg via ORAL
  Filled 2017-10-12 (×2): qty 1

## 2017-10-12 MED ORDER — ALUM & MAG HYDROXIDE-SIMETH 200-200-20 MG/5ML PO SUSP: 30.0000 mL | ORAL | Status: DC | PRN

## 2017-10-12 MED ORDER — ALBUTEROL SULFATE (2.5 MG/3ML) 0.083% IN NEBU: 2.5000 mg | INHALATION_SOLUTION | Freq: Four times a day (QID) | RESPIRATORY_TRACT | Status: DC | PRN

## 2017-10-12 MED ORDER — FENTANYL CITRATE (PF) 250 MCG/5ML IJ SOLN
INTRAMUSCULAR | Status: AC
  Filled 2017-10-12: qty 5

## 2017-10-12 MED ORDER — BUPIVACAINE-EPINEPHRINE (PF) 0.25% -1:200000 IJ SOLN
INTRAMUSCULAR | Status: AC
  Filled 2017-10-12: qty 30

## 2017-10-12 SURGICAL SUPPLY — 73 items
APL SKNCLS STERI-STRIP NONHPOA (GAUZE/BANDAGES/DRESSINGS) ×1
BANDAGE ESMARK 6X9 LF (GAUZE/BANDAGES/DRESSINGS) ×1 IMPLANT
BENZOIN TINCTURE PRP APPL 2/3 (GAUZE/BANDAGES/DRESSINGS) ×3 IMPLANT
BLADE SAGITTAL 25.0X1.19X90 (BLADE) ×2 IMPLANT
BLADE SAGITTAL 25.0X1.19X90MM (BLADE) ×1
BLADE SAW SGTL 13X75X1.27 (BLADE) ×3 IMPLANT
BLADE SURG 10 STRL SS (BLADE) ×6 IMPLANT
BNDG CMPR 9X6 STRL LF SNTH (GAUZE/BANDAGES/DRESSINGS) ×1
BNDG CMPR MED 15X6 ELC VLCR LF (GAUZE/BANDAGES/DRESSINGS) ×1
BNDG ELASTIC 6X15 VLCR STRL LF (GAUZE/BANDAGES/DRESSINGS) ×3 IMPLANT
BNDG ESMARK 6X9 LF (GAUZE/BANDAGES/DRESSINGS) ×3
BOWL SMART MIX CTS (DISPOSABLE) ×3 IMPLANT
CAPT KNEE TOTAL 3 ATTUNE ×2 IMPLANT
CEMENT HV SMART SET (Cement) ×6 IMPLANT
CLOSURE WOUND 1/2 X4 (GAUZE/BANDAGES/DRESSINGS) ×1
COVER SURGICAL LIGHT HANDLE (MISCELLANEOUS) ×3 IMPLANT
CUFF TOURNIQUET SINGLE 34IN LL (TOURNIQUET CUFF) ×3 IMPLANT
CUFF TOURNIQUET SINGLE 44IN (TOURNIQUET CUFF) IMPLANT
DECANTER SPIKE VIAL GLASS SM (MISCELLANEOUS) ×3 IMPLANT
DRAPE EXTREMITY T 121X128X90 (DRAPE) ×3 IMPLANT
DRAPE HALF SHEET 40X57 (DRAPES) ×6 IMPLANT
DRAPE INCISE IOBAN 66X45 STRL (DRAPES) IMPLANT
DRAPE U-SHAPE 47X51 STRL (DRAPES) ×3 IMPLANT
DRSG AQUACEL AG ADV 3.5X10 (GAUZE/BANDAGES/DRESSINGS) ×2 IMPLANT
DRSG AQUACEL AG ADV 3.5X14 (GAUZE/BANDAGES/DRESSINGS) ×3 IMPLANT
DURAPREP 26ML APPLICATOR (WOUND CARE) ×6 IMPLANT
ELECT CAUTERY BLADE 6.4 (BLADE) ×3 IMPLANT
ELECT REM PT RETURN 9FT ADLT (ELECTROSURGICAL) ×3
ELECTRODE REM PT RTRN 9FT ADLT (ELECTROSURGICAL) ×1 IMPLANT
FACESHIELD WRAPAROUND (MASK) ×3 IMPLANT
FACESHIELD WRAPAROUND OR TEAM (MASK) ×1 IMPLANT
GLOVE BIO SURGEON STRL SZ7 (GLOVE) ×3 IMPLANT
GLOVE BIOGEL PI IND STRL 7.0 (GLOVE) ×1 IMPLANT
GLOVE BIOGEL PI IND STRL 7.5 (GLOVE) ×1 IMPLANT
GLOVE BIOGEL PI INDICATOR 7.0 (GLOVE) ×2
GLOVE BIOGEL PI INDICATOR 7.5 (GLOVE) ×2
GLOVE SS BIOGEL STRL SZ 7.5 (GLOVE) ×1 IMPLANT
GLOVE SUPERSENSE BIOGEL SZ 7.5 (GLOVE) ×2
GOWN STRL REUS W/ TWL LRG LVL3 (GOWN DISPOSABLE) ×1 IMPLANT
GOWN STRL REUS W/ TWL XL LVL3 (GOWN DISPOSABLE) ×2 IMPLANT
GOWN STRL REUS W/TWL LRG LVL3 (GOWN DISPOSABLE) ×3
GOWN STRL REUS W/TWL XL LVL3 (GOWN DISPOSABLE) ×6
HANDPIECE INTERPULSE COAX TIP (DISPOSABLE) ×3
HOOD PEEL AWAY FACE SHEILD DIS (HOOD) ×6 IMPLANT
IMMOBILIZER KNEE 22 UNIV (SOFTGOODS) ×3 IMPLANT
KIT BASIN OR (CUSTOM PROCEDURE TRAY) ×3 IMPLANT
KIT ROOM TURNOVER OR (KITS) ×3 IMPLANT
MANIFOLD NEPTUNE II (INSTRUMENTS) ×3 IMPLANT
MARKER SKIN DUAL TIP RULER LAB (MISCELLANEOUS) ×3 IMPLANT
NDL 18GX1X1/2 (RX/OR ONLY) (NEEDLE) ×1 IMPLANT
NEEDLE 18GX1X1/2 (RX/OR ONLY) (NEEDLE) ×3 IMPLANT
NS IRRIG 1000ML POUR BTL (IV SOLUTION) ×3 IMPLANT
PACK TOTAL JOINT (CUSTOM PROCEDURE TRAY) ×3 IMPLANT
PAD ARMBOARD 7.5X6 YLW CONV (MISCELLANEOUS) ×6 IMPLANT
SET HNDPC FAN SPRY TIP SCT (DISPOSABLE) ×1 IMPLANT
STRIP CLOSURE SKIN 1/2X4 (GAUZE/BANDAGES/DRESSINGS) ×2 IMPLANT
SUCTION FRAZIER HANDLE 10FR (MISCELLANEOUS) ×2
SUCTION TUBE FRAZIER 10FR DISP (MISCELLANEOUS) ×1 IMPLANT
SUT MNCRL AB 3-0 PS2 18 (SUTURE) ×3 IMPLANT
SUT VIC AB 0 CT1 27 (SUTURE) ×6
SUT VIC AB 0 CT1 27XBRD ANBCTR (SUTURE) ×2 IMPLANT
SUT VIC AB 1 CT1 27 (SUTURE) ×3
SUT VIC AB 1 CT1 27XBRD ANBCTR (SUTURE) ×1 IMPLANT
SUT VIC AB 2-0 CT1 27 (SUTURE) ×6
SUT VIC AB 2-0 CT1 TAPERPNT 27 (SUTURE) ×2 IMPLANT
SYR 30ML LL (SYRINGE) ×3 IMPLANT
TOWEL OR 17X24 6PK STRL BLUE (TOWEL DISPOSABLE) ×3 IMPLANT
TOWEL OR 17X26 10 PK STRL BLUE (TOWEL DISPOSABLE) ×3 IMPLANT
TRAY CATH 16FR W/PLASTIC CATH (SET/KITS/TRAYS/PACK) IMPLANT
TRAY FOLEY CATH SILVER 16FR (SET/KITS/TRAYS/PACK) ×3 IMPLANT
TUBE CONNECTING 12'X1/4 (SUCTIONS) ×1
TUBE CONNECTING 12X1/4 (SUCTIONS) ×2 IMPLANT
YANKAUER SUCT BULB TIP NO VENT (SUCTIONS) ×3 IMPLANT

## 2017-10-12 NOTE — Anesthesia Procedure Notes (Signed)
Anesthesia Regional Block: Adductor canal block   Pre-Anesthetic Checklist: ,, timeout performed, Correct Patient, Correct Site, Correct Laterality, Correct Procedure,, site marked, risks and benefits discussed, Surgical consent,  Pre-op evaluation,  At surgeon's request and post-op pain management  Laterality: Left  Prep: chloraprep       Needles:  Injection technique: Single-shot  Needle Type: Echogenic Stimulator Needle     Needle Length: 9cm  Needle Gauge: 21     Additional Needles:   Procedures:, nerve stimulator,,, ultrasound used (permanent image in chart), intact distal pulses,,,  Narrative:  Start time: 10/12/2017 7:05 AM End time: 10/12/2017 7:15 AM Injection made incrementally with aspirations every 5 mL.  Performed by: Personally  Anesthesiologist: Murvin Natal, MD  Additional Notes: Functioning IV was confirmed and monitors were applied.  A 57mm 21ga Arrow echogenic stimulator needle was used. Sterile prep, hand hygiene and sterile gloves were used.  Negative aspiration and negative test dose prior to incremental administration of local anesthetic. The patient tolerated the procedure well.

## 2017-10-12 NOTE — Anesthesia Procedure Notes (Signed)
Spinal  Patient location during procedure: OR Start time: 10/12/2017 7:20 AM End time: 10/12/2017 7:30 AM Staffing Anesthesiologist: Murvin Natal, MD Performed: anesthesiologist  Preanesthetic Checklist Completed: patient identified, surgical consent, pre-op evaluation, timeout performed, IV checked, risks and benefits discussed and monitors and equipment checked Spinal Block Patient position: sitting Prep: DuraPrep Patient monitoring: cardiac monitor, continuous pulse ox and blood pressure Approach: left paramedian Location: L4-5 Injection technique: single-shot Needle Needle type: Pencan  Needle gauge: 24 G Needle length: 9 cm Assessment Sensory level: T10 Additional Notes Functioning IV was confirmed and monitors were applied. Sterile prep and drape, including hand hygiene and sterile gloves were used. The patient was positioned and the spine was prepped. The skin was anesthetized with lidocaine.  Free flow of clear CSF was obtained prior to injecting local anesthetic into the CSF.  The spinal needle aspirated freely following injection.  The needle was carefully withdrawn.  The patient tolerated the procedure well.

## 2017-10-12 NOTE — Evaluation (Signed)
Physical Therapy Evaluation Patient Details Name: [name] MRN: [id number] DOB: 1956/04/03 Today's Date: 10/12/2017   History of Present Illness  Pt is a 61 y/o female s/p elective L TKA. PMH includes asthma, OSA on CPAP, depression, back surgery, and L knee arthroscopy.   Clinical Impression  Pt is s/p surgery above with deficits below. PTA, pt was independent with functional mobility. Upon eval, pt presenting with post op pain and weakness, however, tolerated session very well. Required min guard assist for mobility with RW this session. Reports husband will be able to assist as needed upon d/c home and will need DME below. Follow up recommendations per MD arrangements. Will continue to follow acutely to maximize functional mobility independence and safety.     Follow Up Recommendations DC plan and follow up therapy as arranged by surgeon;Supervision for mobility/OOB    Equipment Recommendations  Rolling walker with 5" wheels;3in1 (PT)    Recommendations for Other Services       Precautions / Restrictions Precautions Precautions: Knee Precaution Booklet Issued: Yes (comment) Precaution Comments: Reviewed supine ther ex with pt.  Required Braces or Orthoses: Knee Immobilizer - Left Knee Immobilizer - Left: Other (comment)(until discontinued ) Restrictions Weight Bearing Restrictions: Yes LLE Weight Bearing: Weight bearing as tolerated      Mobility  Bed Mobility Overal bed mobility: Needs Assistance Bed Mobility: Supine to Sit     Supine to sit: Min guard     General bed mobility comments: Min guard for safety. Verbal cues for sequencing.   Transfers Overall transfer level: Needs assistance Equipment used: Rolling walker (2 wheeled) Transfers: Sit to/from Stand Sit to Stand: Min guard         General transfer comment: Min guard for steadying assist. Verbal cues for safe hand placement.   Ambulation/Gait Ambulation/Gait assistance: Min guard Ambulation  Distance (Feet): 75 Feet Assistive device: Rolling walker (2 wheeled) Gait Pattern/deviations: Step-through pattern;Step-to pattern;Decreased step length - right;Decreased step length - left;Decreased weight shift to left;Antalgic Gait velocity: Decreased  Gait velocity interpretation: Below normal speed for age/gender General Gait Details: Slow, antalgic gait secondary to post op pain and weakness. Verbal cues for sequencing using RW. Started as step to gait, however, able to increase WB on LLE to perform step through gait.   Stairs            Wheelchair Mobility    Modified Rankin (Stroke Patients Only)       Balance Overall balance assessment: Needs assistance Sitting-balance support: No upper extremity supported;Feet supported Sitting balance-Leahy Scale: Good     Standing balance support: Bilateral upper extremity supported;During functional activity Standing balance-Leahy Scale: Poor Standing balance comment: Reliant on UE support for balance                              Pertinent Vitals/Pain Pain Assessment: 0-10 Pain Score: 6  Pain Location: L knee  Pain Descriptors / Indicators: Aching;Throbbing;Operative site guarding Pain Intervention(s): Limited activity within patient's tolerance;Monitored during session;Repositioned    Home Living Family/patient expects to be discharged to:: Private residence Living Arrangements: Spouse/significant other Available Help at Discharge: Family;Available 24 hours/day Type of Home: House Home Access: Stairs to enter   Entrance Stairs-Number of Steps: 1(threshold ) Home Layout: One level Home Equipment: Shower seat - built in      Prior Function Level of Independence: Independent  Hand Dominance   Dominant Hand: Right    Extremity/Trunk Assessment   Upper Extremity Assessment Upper Extremity Assessment: Defer to OT evaluation    Lower Extremity Assessment Lower Extremity  Assessment: LLE deficits/detail LLE Deficits / Details: Sensory in tact. Deficits consistent with post op pain and weakness. Able to perform ther ex below.     Cervical / Trunk Assessment Cervical / Trunk Assessment: Normal  Communication   Communication: No difficulties  Cognition Arousal/Alertness: Awake/alert Behavior During Therapy: WFL for tasks assessed/performed Overall Cognitive Status: Within Functional Limits for tasks assessed                                        General Comments General comments (skin integrity, edema, etc.): Pt's husband present during session.     Exercises Total Joint Exercises Ankle Circles/Pumps: AROM;Both;20 reps;Supine Quad Sets: AROM;Left;10 reps Towel Squeeze: AROM;Both;10 reps Short Arc Quad: AROM;Left;10 reps Heel Slides: AROM;Left;10 reps Hip ABduction/ADduction: AROM;Left;10 reps Straight Leg Raises: AROM;Left;Other reps (comment)(4 for practice with technique )   Assessment/Plan    PT Assessment Patient needs continued PT services  PT Problem List Decreased strength;Decreased range of motion;Decreased balance;Decreased mobility;Decreased knowledge of use of DME;Decreased knowledge of precautions;Pain       PT Treatment Interventions DME instruction;Gait training;Stair training;Functional mobility training;Therapeutic activities;Therapeutic exercise;Balance training;Neuromuscular re-education;Patient/family education    PT Goals (Current goals can be found in the Care Plan section)  Acute Rehab PT Goals Patient Stated Goal: to go home  PT Goal Formulation: With patient Time For Goal Achievement: 10/19/17 Potential to Achieve Goals: Good    Frequency 7X/week   Barriers to discharge        Co-evaluation               AM-PAC PT "6 Clicks" Daily Activity  Outcome Measure Difficulty turning over in bed (including adjusting bedclothes, sheets and blankets)?: A Little Difficulty moving from lying on back  to sitting on the side of the bed? : A Little Difficulty sitting down on and standing up from a chair with arms (e.g., wheelchair, bedside commode, etc,.)?: Unable Help needed moving to and from a bed to chair (including a wheelchair)?: A Little Help needed walking in hospital room?: A Little Help needed climbing 3-5 steps with a railing? : A Little 6 Click Score: 16    End of Session Equipment Utilized During Treatment: Gait belt;Left knee immobilizer Activity Tolerance: Patient tolerated treatment well Patient left: in chair;with call bell/phone within reach;with family/visitor present Nurse Communication: Mobility status PT Visit Diagnosis: Other abnormalities of gait and mobility (R26.89);Pain Pain - Right/Left: Left Pain - part of body: Knee    Time: 1610-9604 PT Time Calculation (min) (ACUTE ONLY): 35 min   Charges:   PT Evaluation $PT Eval Low Complexity: 1 Low PT Treatments $Gait Training: 8-22 mins   PT G Codes:        Leighton Ruff, PT, DPT  Acute Rehabilitation Services  Pager: [phone]   Rudean Hitt 10/12/2017, 3:53 PM

## 2017-10-12 NOTE — Progress Notes (Signed)
Orthopedic Tech Progress Note Patient Details:  [name] January 21, 1956 [id number]  CPM Left Knee CPM Left Knee: On Left Knee Flexion (Degrees): 90 Left Knee Extension (Degrees): 0 Additional Comments: foot roll   [place] Pink 10/12/2017, 9:58 AM

## 2017-10-12 NOTE — Op Note (Signed)
MRN:     [id number] DOB/AGE:    06/17/1956 / 61 y.o.       OPERATIVE REPORT    DATE OF PROCEDURE:  10/12/2017       PREOPERATIVE DIAGNOSIS:   Primary localized OA left knee      Estimated body mass index is 33.15 kg/m as calculated from the following:   Height as of 10/02/17: 5\' 8"  (1.727 m).   Weight as of 10/02/17: 98.9 kg (218 lb).                                                        POSTOPERATIVE DIAGNOSIS:   Primary localized OA left knee                                                                      PROCEDURE:  Procedure(s): LEFT TOTAL KNEE ARTHROPLASTY Using Depuy Attune RP implants #6 narrow Femur, #6Tibia, 53mm  RP bearing, 32 Patella     SURGEON: Derel Mcglasson A    ASSISTANT:  Kirstin Shepperson PA-C   (Present and scrubbed throughout the case, critical for assistance with exposure, retraction, instrumentation, and closure.)         ANESTHESIA: Spinal with Adductor Nerve Block     TOURNIQUET TIME: 95MWU   COMPLICATIONS:  None     SPECIMENS: None   INDICATIONS FOR PROCEDURE: The patient has  djd left knee, varus deformities, XR shows bone on bone arthritis. Patient has failed all conservative measures including anti-inflammatory medicines, narcotics, attempts at  exercise and weight loss, cortisone injections and viscosupplementation.  Risks and benefits of surgery have been discussed, questions answered.   DESCRIPTION OF PROCEDURE: The patient identified by armband, received  right femoral nerve block and IV antibiotics, in the holding area at [facility]. Patient taken to the operating room, appropriate anesthetic  monitors were attached Spinal anesthesia induced with  the patient in supine position, Foley catheter was inserted. Tourniquet  applied high to the operative thigh. Lateral post and foot positioner  applied to the table, the lower extremity was then prepped and draped  in usual sterile fashion from the ankle to the tourniquet. Time-out procedure  was performed. The limb was wrapped with an Esmarch bandage and the tourniquet inflated to 365 mmHg. We began the operation by making the anterior midline incision starting at handbreadth above the patella going over the patella 1 cm medial to and  4 cm distal to the tibial tubercle. Small bleeders in the skin and the  subcutaneous tissue identified and cauterized. Transverse retinaculum was incised and reflected medially and a medial parapatellar arthrotomy was accomplished. the patella was everted and theprepatellar fat pad resected. The superficial medial collateral  ligament was then elevated from anterior to posterior along the proximal  flare of the tibia and anterior half of the menisci resected. The knee was hyperflexed exposing bone on bone arthritis. Peripheral and notch osteophytes as well as the cruciate ligaments were then resected. We continued to  work our way around posteriorly along the proximal tibia, and externally  rotated the  tibia subluxing it out from underneath the femur. A McHale  retractor was placed through the notch and a lateral Hohmann retractor  placed, and we then drilled through the proximal tibia in line with the  axis of the tibia followed by an intramedullary guide rod and 2-degree  posterior slope cutting guide. The tibial cutting guide was pinned into place  allowing resection of 4 mm of bone medially and about 6 mm of bone  laterally because of her varus deformity. Satisfied with the tibial resection, we then  entered the distal femur 2 mm anterior to the PCL origin with the  intramedullary guide rod and applied the distal femoral cutting guide  set at 10mm, with 5 degrees of valgus. This was pinned along the  epicondylar axis. At this point, the distal femoral cut was accomplished without difficulty. We then sized for a #6 narrow femoral component and pinned the guide in 3 degrees of external rotation.The chamfer cutting guide was pinned into place. The  anterior, posterior, and chamfer cuts were accomplished without difficulty followed by  the  RP box cutting guide and the box cut. We also removed posterior osteophytes from the posterior femoral condyles. At this  time, the knee was brought into full extension. We checked our  extension and flexion gaps and found them symmetric at 73mm.  The patella thickness measured at 22 mm. We set the cutting guide at 13 and removed the posterior 8 mm  of the patella sized for 32 button and drilled the lollipop. The knee  was then once again hyperflexed exposing the proximal tibia. We sized for a #6 tibial base plate, applied the smokestack and the conical reamer followed by the the Delta fin keel punch. We then hammered into place the  RP trial femoral component, inserted a 1 trial bearing, trial patellar button, and took the knee through range of motion from 0-130 degrees. No thumb pressure was required for patellar  tracking. At this point, all trial components were removed, a double batch of DePuy HV cement  was mixed and applied to all bony metallic mating surfaces except for the posterior condyles of the femur itself. In order, we  hammered into place the tibial tray and removed excess cement, the femoral component and removed excess cement, a 46mm  RP bearing  was inserted, and the knee brought to full extension with compression.  The patellar button was clamped into place, and excess cement  removed. While the cement cured the wound was irrigated out with normal saline solution pulse lavage.. Ligament stability and patellar tracking were checked and found to be excellent.. The parapatellar arthrotomy was closed with  #1 Vicryl suture. The subcutaneous tissue with 0 and 2-0 undyed  Vicryl suture, and 4-0 Monocryl.. A dressing of Aquaseal,  4 x 4, dressing sponges, Webril, and Ace wrap applied. Needle and sponge count were correct times 2.The patient awakened, extubated, and taken to recovery room without  difficulty. Vascular status was normal, pulses 2+ and symmetric.   Rikita Grabert A 10/12/2017, 9:03 AM

## 2017-10-12 NOTE — Consult Note (Signed)
   [facility] CM Inpatient Consult   10/12/2017  TYAN LASURE 1956-04-14 [id number]     Came to visit Mrs. Vanhecke at bedside on behalf of [employer] [facility] Care Management for [employer] employees/dependents with [facility] insurance.  Mrs. Ruybal denies having  [facility] Care Management needs at this time.  Provided 24-hr nurse line magnet, [employer].  Appreciative of visit.  Agreeable to post hospital discharge call.  Made inpatient RNCM aware of visit.   Marthenia Rolling, MSN-Ed, RN,BSN [facility] Liaison [phone]

## 2017-10-12 NOTE — Anesthesia Preprocedure Evaluation (Addendum)
Anesthesia Evaluation  Patient identified by MRN, date of birth, ID band Patient awake    Reviewed: Allergy & Precautions, NPO status , Patient's Chart, lab work & pertinent test results  Airway Mallampati: II  TM Distance: >3 FB Neck ROM: Full    Dental  (+) Teeth Intact, Dental Advisory Given   Pulmonary asthma (controlled) , sleep apnea and Continuous Positive Airway Pressure Ventilation ,    breath sounds clear to auscultation       Cardiovascular Exercise Tolerance: Good negative cardio ROS   Rhythm:Regular Rate:Normal     Neuro/Psych  Headaches, PSYCHIATRIC DISORDERS Anxiety Depression    GI/Hepatic Neg liver ROS, GERD  Medicated and Controlled,  Endo/Other  negative endocrine ROS  Renal/GU negative Renal ROS     Musculoskeletal  (+) Arthritis , Osteoarthritis,    Abdominal (+) + obese,   Peds  Hematology   Anesthesia Other Findings Urticaria Allergic rhinitis  Reproductive/Obstetrics                            Anesthesia Physical  Anesthesia Plan  ASA: II  Anesthesia Plan: Regional and Spinal   Post-op Pain Management:    Induction:   PONV Risk Score and Plan: 2 and Ondansetron, Dexamethasone and Midazolam  Airway Management Planned: Natural Airway  Additional Equipment:   Intra-op Plan:   Post-operative Plan:   Informed Consent: I have reviewed the patients History and Physical, chart, labs and discussed the procedure including the risks, benefits and alternatives for the proposed anesthesia with the patient or authorized representative who has indicated his/her understanding and acceptance.   Dental advisory given  Plan Discussed with: CRNA  Anesthesia Plan Comments:         Anesthesia Quick Evaluation

## 2017-10-12 NOTE — Interval H&P Note (Signed)
History and Physical Interval Note:  10/12/2017 7:04 AM  [name]  has presented today for surgery, with the diagnosis of djd left knee  The various methods of treatment have been discussed with the patient and family. After consideration of risks, benefits and other options for treatment, the patient has consented to  Procedure(s): LEFT TOTAL KNEE ARTHROPLASTY (Left) as a surgical intervention .  The patient's history has been reviewed, patient examined, no change in status, stable for surgery.  I have reviewed the patient's chart and labs.  Questions were answered to the patient's satisfaction.     [name]

## 2017-10-12 NOTE — Transfer of Care (Signed)
Immediate Anesthesia Transfer of Care Note  Patient: [name]  Procedure(s) Performed: LEFT TOTAL KNEE ARTHROPLASTY (Left Knee)  Patient Location: PACU  Anesthesia Type:MAC  Level of Consciousness: awake, alert  and oriented  Airway & Oxygen Therapy: Patient Spontanous Breathing  Post-op Assessment: Report given to RN and Post -op Vital signs reviewed and stable  Post vital signs: Reviewed and stable  Last Vitals:  Vitals:   10/12/17 0546  BP: 134/87  Pulse: 89  Resp: 18  Temp: 36.7 C  SpO2: 99%    Last Pain:  Vitals:   10/12/17 0603  PainSc: 5       Patients Stated Pain Goal: 4 (71/06/26 9485)  Complications: No apparent anesthesia complications

## 2017-10-13 ENCOUNTER — Encounter (HOSPITAL_COMMUNITY): Payer: Self-pay | Admitting: Orthopedic Surgery

## 2017-10-13 LAB — BASIC METABOLIC PANEL
ANION GAP: 7 (ref 5–15)
BUN: 10 mg/dL (ref 6–20)
CO2: 23 mmol/L (ref 22–32)
Calcium: 8.8 mg/dL — ABNORMAL LOW (ref 8.9–10.3)
Chloride: 108 mmol/L (ref 101–111)
Creatinine, Ser: 0.72 mg/dL (ref 0.44–1.00)
GFR calc Af Amer: >60 mL/min (ref >60)
Glucose, Bld: 154 mg/dL — ABNORMAL HIGH (ref 65–99)
POTASSIUM: 4 mmol/L (ref 3.5–5.1)
SODIUM: 138 mmol/L (ref 135–145)

## 2017-10-13 LAB — CBC
HCT: 34.7 % — ABNORMAL LOW (ref 36.0–46.0)
Hemoglobin: 11.1 g/dL — ABNORMAL LOW (ref 12.0–15.0)
MCH: 29.2 pg (ref 26.0–34.0)
MCHC: 32 g/dL (ref 30.0–36.0)
MCV: 91.3 fL (ref 78.0–100.0)
PLATELETS: 294 10*3/uL (ref 150–400)
RBC: 3.8 MIL/uL — AB (ref 3.87–5.11)
RDW: 13.6 % (ref 11.5–15.5)
WBC: 15.5 10*3/uL — ABNORMAL HIGH (ref 4.0–10.5)

## 2017-10-13 MED ORDER — ACETAMINOPHEN 325 MG PO TABS: 650.0000 mg | ORAL_TABLET | ORAL | Status: AC | PRN

## 2017-10-13 MED ORDER — OXYCODONE HCL 10 MG PO TABS: ORAL_TABLET | ORAL | 0 refills | Status: DC

## 2017-10-13 MED ORDER — POLYETHYLENE GLYCOL 3350 17 G PO PACK: 17.0000 g | PACK | Freq: Two times a day (BID) | ORAL | 0 refills | Status: DC

## 2017-10-13 MED ORDER — ASPIRIN 325 MG PO TBEC: DELAYED_RELEASE_TABLET | ORAL | 0 refills | Status: AC

## 2017-10-13 MED ORDER — DOCUSATE SODIUM 100 MG PO CAPS: ORAL_CAPSULE | ORAL | 0 refills | Status: DC

## 2017-10-13 NOTE — Care Management Note (Signed)
Case Management Note  Patient Details  Name: [name] MRN: [id number] Date of Birth: 30-Oct-1956  Subjective/Objective:     Left TKA              Action/Plan: Discharge Planning: NCM spoke to pt and husband, Barnabas Lister at bedside. Offered choice for HH/list provided. Pt agreeable to [facility]. Contacted [facility] Liaison with new referral. [facility] was able to accept referral. Contacted [facility] for 3n1 and RW. [employer] will deliver CPM to home.   PCP Jearld Fenton MD     Expected Discharge Date:  10/13/17               Expected Discharge Plan:  [place]  In-House Referral:  NA  Discharge planning Services  CM Consult  Post Acute Care Choice:  Home Health Choice offered to:  Patient  DME Arranged:  3-N-1, Walker rolling DME Agency:  [place] [employer]  HH Arranged:  PT HH Agency:  [place]  Status of Service:  Completed, signed off  If discussed at [place] of Stay Meetings, dates discussed:    Additional Comments:  Erenest Rasher, RN 10/13/2017, 11:00 AM

## 2017-10-13 NOTE — Progress Notes (Signed)
Physical Therapy Treatment Patient Details Name: [name] MRN: [id number] DOB: 26-Jun-1956 Today's Date: 10/13/2017    History of Present Illness Pt is a 61 y/o female s/p elective L TKA. PMH includes asthma, OSA on CPAP, depression, back surgery, and L knee arthroscopy.     PT Comments    Session focused progressing therex. Pt demonstrating improved independence with all mobility. Pt and family educated on safety considerations for home and have no further questions or concerns at this time. Pt has met all functional goals and will benefit from skilled home health PT when medically cleared for d/c.     Follow Up Recommendations  DC plan and follow up therapy as arranged by surgeon;Supervision for mobility/OOB;Home health PT     Equipment Recommendations  Rolling walker with 5" wheels;3in1 (PT)    Recommendations for Other Services       Precautions / Restrictions Precautions Precautions: Knee Restrictions Weight Bearing Restrictions: Yes LLE Weight Bearing: Weight bearing as tolerated    Mobility  Bed Mobility   Bed Mobility: Sit to Supine       Sit to supine: Modified independent (Device/Increase time)   General bed mobility comments: no assist to return to bed  Transfers Overall transfer level: Needs assistance Equipment used: Rolling walker (2 wheeled) Transfers: Sit to/from Stand Sit to Stand: Supervision         General transfer comment: cues for hand placement with sit to stand  Ambulation/Gait                 Stairs            Wheelchair Mobility    Modified Rankin (Stroke Patients Only)       Balance Overall balance assessment: Needs assistance Sitting-balance support: No upper extremity supported;Feet supported Sitting balance-Leahy Scale: Good     Standing balance support: Bilateral upper extremity supported;During functional activity Standing balance-Leahy Scale: Fair Standing balance comment: able to release walker  at sink                            Cognition Arousal/Alertness: Awake/alert Behavior During Therapy: WFL for tasks assessed/performed Overall Cognitive Status: Within Functional Limits for tasks assessed                                        Exercises Total Joint Exercises Ankle Circles/Pumps: AROM;Both;20 reps;Supine Quad Sets: AROM;Left;10 reps Towel Squeeze: AROM;Both;10 reps Short Arc Quad: AROM;Left;10 reps Straight Leg Raises: AROM;Left;Other reps (comment) Knee Flexion: AROM;Left;10 reps    General Comments        Pertinent Vitals/Pain Pain Assessment: Faces Faces Pain Scale: Hurts little more Pain Location: L knee  Pain Descriptors / Indicators: Aching;Throbbing;Operative site guarding Pain Intervention(s): Monitored during session;Limited activity within patient's tolerance    Home Living                      Prior Function            PT Goals (current goals can now be found in the care plan section) Acute Rehab PT Goals Patient Stated Goal: to go home  PT Goal Formulation: With patient Time For Goal Achievement: 10/19/17 Potential to Achieve Goals: Good Progress towards PT goals: Goals met/education completed, patient discharged from PT    Frequency  PT Plan Current plan remains appropriate    Co-evaluation              AM-PAC PT "6 Clicks" Daily Activity  Outcome Measure  Difficulty turning over in bed (including adjusting bedclothes, sheets and blankets)?: A Little Difficulty moving from lying on back to sitting on the side of the bed? : A Little Difficulty sitting down on and standing up from a chair with arms (e.g., wheelchair, bedside commode, etc,.)?: A Little Help needed moving to and from a bed to chair (including a wheelchair)?: A Little Help needed walking in hospital room?: A Little Help needed climbing 3-5 steps with a railing? : A Little 6 Click Score: 18    End of Session  Equipment Utilized During Treatment: Gait belt Activity Tolerance: Patient tolerated treatment well Patient left: in chair;with call bell/phone within reach;with family/visitor present Nurse Communication: Mobility status PT Visit Diagnosis: Other abnormalities of gait and mobility (R26.89);Pain Pain - Right/Left: Left Pain - part of body: Knee     Time: 8325-4982 PT Time Calculation (min) (ACUTE ONLY): 24 min  Charges:  $Therapeutic Exercise: 8-22 mins                    G Codes:       Reinaldo Berber, PT, DPT Acute Rehab Services Pager: [phone]     Reinaldo Berber 10/13/2017, 2:33 PM

## 2017-10-13 NOTE — Progress Notes (Signed)
Physical Therapy Treatment Patient Details Name: [name] MRN: [id number] DOB: 07-10-1956 Today's Date: 10/13/2017    History of Present Illness Pt is a 61 y/o female s/p elective L TKA. PMH includes asthma, OSA on CPAP, depression, back surgery, and L knee arthroscopy.     PT Comments    Pt progressing well towards goals at this time. Improved balance and level of independence with transfers and gait, as well as improved activity tolerence. Session included stair training for safe home d/c, pt performing safely. PM session will progress therex and ensure patient feels confident in preparation for d/c home this evening.      Follow Up Recommendations  DC plan and follow up therapy as arranged by surgeon;Supervision for mobility/OOB     Equipment Recommendations  Rolling walker with 5" wheels;3in1 (PT)    Recommendations for Other Services       Precautions / Restrictions Precautions Precautions: Knee Precaution Booklet Issued: Yes (comment) Precaution Comments: Reviewed supine ther ex with pt.  Restrictions Weight Bearing Restrictions: Yes LLE Weight Bearing: Weight bearing as tolerated    Mobility  Bed Mobility               General bed mobility comments: OOB upon entry  Transfers Overall transfer level: Needs assistance Equipment used: Rolling walker (2 wheeled) Transfers: Sit to/from Stand Sit to Stand: Supervision         General transfer comment: Supervison for safety during sit<>stand. Proper hand placement by patient  Ambulation/Gait Ambulation/Gait assistance: Supervision Ambulation Distance (Feet): 500 Feet Assistive device: Rolling walker (2 wheeled) Gait Pattern/deviations: Step-through pattern;Step-to pattern;Decreased step length - right;Decreased step length - left;Decreased weight shift to left;Antalgic Gait velocity: less decreased    General Gait Details: improved velocity, antalgic gait, step through  consistently   Stairs Stairs: Yes   Stair Management: Step to pattern;No rails Number of Stairs: 4 General stair comments: cues for sequencing and safety with RW on platform stairs for home setup  Wheelchair Mobility    Modified Rankin (Stroke Patients Only)       Balance Overall balance assessment: Needs assistance Sitting-balance support: No upper extremity supported;Feet supported Sitting balance-Leahy Scale: Good     Standing balance support: Bilateral upper extremity supported;During functional activity Standing balance-Leahy Scale: Fair                              Cognition Arousal/Alertness: Awake/alert Behavior During Therapy: WFL for tasks assessed/performed Overall Cognitive Status: Within Functional Limits for tasks assessed                                        Exercises Total Joint Exercises Ankle Circles/Pumps: AROM;Both;20 reps;Supine Quad Sets: AROM;Left;10 reps Towel Squeeze: AROM;Both;10 reps Short Arc Quad: AROM;Left;10 reps Straight Leg Raises: AROM;Left;Other reps (comment) Knee Flexion: AROM;Left;10 reps Goniometric ROM: 95    General Comments General comments (skin integrity, edema, etc.): VSS throughout session after increased activity.       Pertinent Vitals/Pain Pain Assessment: Faces Faces Pain Scale: Hurts little more Pain Location: L knee  Pain Descriptors / Indicators: Aching;Throbbing;Operative site guarding Pain Intervention(s): Limited activity within patient's tolerance;Monitored during session;Repositioned    Home Living                      Prior Function  PT Goals (current goals can now be found in the care plan section) Acute Rehab PT Goals Patient Stated Goal: to go home  PT Goal Formulation: With patient Time For Goal Achievement: 10/19/17 Potential to Achieve Goals: Good Progress towards PT goals: Progressing toward goals    Frequency    7X/week       PT Plan Current plan remains appropriate    Co-evaluation              AM-PAC PT "6 Clicks" Daily Activity  Outcome Measure  Difficulty turning over in bed (including adjusting bedclothes, sheets and blankets)?: A Little Difficulty moving from lying on back to sitting on the side of the bed? : A Little Difficulty sitting down on and standing up from a chair with arms (e.g., wheelchair, bedside commode, etc,.)?: A Little Help needed moving to and from a bed to chair (including a wheelchair)?: A Little Help needed walking in hospital room?: A Little Help needed climbing 3-5 steps with a railing? : A Little 6 Click Score: 18    End of Session Equipment Utilized During Treatment: Gait belt Activity Tolerance: Patient tolerated treatment well Patient left: in chair;with call bell/phone within reach;with family/visitor present Nurse Communication: Mobility status PT Visit Diagnosis: Other abnormalities of gait and mobility (R26.89);Pain Pain - Right/Left: Left Pain - part of body: Knee     Time: 0017-4944 PT Time Calculation (min) (ACUTE ONLY): 28 min  Charges:  $Gait Training: 8-22 mins $Therapeutic Exercise: 8-22 mins                    G Codes:       Reinaldo Berber, PT, DPT Acute Rehab Services Pager: [phone]    Reinaldo Berber 10/13/2017, 9:59 AM

## 2017-10-13 NOTE — Anesthesia Postprocedure Evaluation (Signed)
Anesthesia Post Note  Patient: [name]  Procedure(s) Performed: LEFT TOTAL KNEE ARTHROPLASTY (Left Knee)     Patient location during evaluation: PACU Anesthesia Type: Regional and Spinal Level of consciousness: oriented and awake and alert Pain management: pain level controlled Vital Signs Assessment: post-procedure vital signs reviewed and stable Respiratory status: spontaneous breathing, respiratory function stable and patient connected to nasal cannula oxygen Cardiovascular status: blood pressure returned to baseline and stable Postop Assessment: no headache, no backache and no apparent nausea or vomiting Anesthetic complications: no    Last Vitals:  Vitals:   10/13/17 0916 10/13/17 1500  BP:  (!) 149/83  Pulse:  99  Resp:  16  Temp:  36.9 C  SpO2: 96% 100%    Last Pain:  Vitals:   10/13/17 1529  TempSrc:   PainSc: 8                  Ryan P Ellender

## 2017-10-13 NOTE — Discharge Summary (Signed)
Patient ID: [name] MRN: [id number] DOB/AGE: 06-28-1956 61 y.o.  Admit date: 10/12/2017 Discharge date: 10/13/2017  Admission Diagnoses:  Principal Problem:   Primary localized osteoarthritis of left knee Active Problems:   Seasonal allergies   Asthma, mild persistent   GERD   Obstructive sleep apnea   Anxiety and depression   Discharge Diagnoses:  Same  Past Medical History:  Diagnosis Date  . Acute medial meniscus tear of left knee   . Allergic rhinitis   . Angioedema 1999  . Anxiety   . Arthritis   . Asthma   . Depression   . GERD (gastroesophageal reflux disease)   . Headache    migraines  . Primary localized osteoarthritis of left knee   . Sleep apnea    uses CPAP nightly  . Urticaria 1999    Surgeries: Procedure(s): LEFT TOTAL KNEE ARTHROPLASTY on 10/12/2017   Consultants:   Discharged Condition: Improved  Hospital Course: [name] is an 61 y.o. female who was admitted 10/12/2017 for operative treatment ofPrimary localized osteoarthritis of left knee. Patient has severe unremitting pain that affects sleep, daily activities, and work/hobbies. After pre-op clearance the patient was taken to the operating room on 10/12/2017 and underwent  Procedure(s): LEFT TOTAL KNEE ARTHROPLASTY.    Patient was given perioperative antibiotics:  Anti-infectives (From admission, onward)   Start     Dose/Rate Route Frequency Ordered Stop   10/12/17 1300  ceFAZolin (ANCEF) IVPB 2g/100 mL premix     2 g 200 mL/hr over 30 Minutes Intravenous Every 6 hours 10/12/17 1113 10/12/17 1839   10/12/17 0547  ceFAZolin (ANCEF) IVPB 2g/100 mL premix     2 g 200 mL/hr over 30 Minutes Intravenous On call to O.R. 10/12/17 0547 10/12/17 0730       Patient was given sequential compression devices, early ambulation, and chemoprophylaxis to prevent DVT.  Patient benefited maximally from hospital stay and there were no complications.    Recent vital signs:  Patient Vitals  for the past 24 hrs:  BP Temp Temp src Pulse Resp SpO2  10/13/17 0916 - - - - - 96 %  10/13/17 0553 113/71 98.1 F (36.7 C) Oral 87 15 96 %  10/13/17 0030 107/80 98.4 F (36.9 C) Oral 78 14 96 %  10/12/17 2207 - - - 81 18 96 %  10/12/17 2024 - - - - - 95 %  10/12/17 2005 115/76 98.3 F (36.8 C) Oral 83 18 94 %  10/12/17 1500 129/84 98.2 F (36.8 C) Oral 86 18 94 %     Recent laboratory studies:  Recent Labs    10/13/17 0535  WBC 15.5*  HGB 11.1*  HCT 34.7*  PLT 294  NA 138  K 4.0  CL 108  CO2 23  BUN 10  CREATININE 0.72  GLUCOSE 154*  CALCIUM 8.8*     Discharge Medications:   Allergies as of 10/13/2017   No Known Allergies     Medication List    STOP taking these medications   celecoxib 100 MG capsule Commonly known as:  CELEBREX     TAKE these medications   acetaminophen 325 MG tablet Commonly known as:  TYLENOL Take 2 tablets (650 mg total) every 4 (four) hours as needed by mouth for mild pain ((score 1 to 3) or temp > 100.5).   ADVAIR DISKUS 100-50 MCG/DOSE Aepb Generic drug:  Fluticasone-Salmeterol INHALE 1 PUFF INTO THE LUNGS 2 TIMES DAILY. RINSE MOUTH   Albuterol Sulfate  108 (90 Base) MCG/ACT Aepb Commonly known as:  PROAIR RESPICLICK Inhale 2 puffs into the lungs 4 (four) times daily as needed. What changed:  reasons to take this   ALLEGRA 180 MG tablet Generic drug:  fexofenadine Take 180 mg by mouth daily.   ARIPiprazole 2 MG tablet Commonly known as:  ABILIFY Take 2 mg by mouth daily.   aspirin 325 MG EC tablet 1 tab a day for the next 30 days to prevent blood clots What changed:    medication strength  how much to take  how to take this  when to take this  additional instructions   clonazePAM 1 MG tablet Commonly known as:  KLONOPIN Take 1 mg by mouth 2 (two) times daily.   desvenlafaxine 100 MG 24 hr tablet Commonly known as:  PRISTIQ Take 100 mg by mouth daily.   docusate sodium 100 MG capsule Commonly known as:   COLACE 1 tab 2 times a day while on narcotics.  STOOL SOFTENER   omeprazole 20 MG capsule Commonly known as:  PRILOSEC TAKE 1 CAPSULE BY MOUTH DAILY   Oxycodone HCl 10 MG Tabs 1 po q 4 hrs prn pain   polyethylene glycol packet Commonly known as:  MIRALAX / GLYCOLAX Take 17 g 2 (two) times daily by mouth.   PROBIOTIC ADVANCED PO Take 1 tablet by mouth daily.   topiramate 100 MG tablet Commonly known as:  TOPAMAX TAKE 2 TABLETS BY MOUTH AT BEDTIME.   Vitamin D 2000 units tablet Take 2,000 Units by mouth daily.            Durable Medical Equipment  (From admission, onward)        Start     Ordered   10/12/17 1128  For home use only DME 3 n 1  Once     10/12/17 1127   10/12/17 1127  For home use only DME Walker rolling  Once    Question:  Patient needs a walker to treat with the following condition  Answer:  S/P total knee arthroplasty, left   10/12/17 1127       Discharge Care Instructions  (From admission, onward)        Start     Ordered   10/13/17 0000  Change dressing    Comments:  DO NOT REMOVE BANDAGE OVER SURGICAL INCISION.  [place] WHOLE LEG INCLUDING OVER THE WATERPROOF BANDAGE WITH SOAP AND WATER EVERY DAY.   10/13/17 0849      Diagnostic Studies: No results found.  Disposition: 01-Home or Self Care  Discharge Instructions    AMB Referral to [facility]   Complete by:  As directed    Please assign [employer] member for post hospital discharge call. Currently at [facility]. Marthenia Rolling, [place], RN,BSN [facility] [phone]   Reason for consult:  Please assign UMR member for post hospital discharge call   Expected date of contact:  1-3 days (reserved for hospital discharges)   CPM   Complete by:  As directed    Continuous passive motion machine (CPM):      Use the CPM from 0 to 90 for 6 hours per day.       You may break it up into 2 or 3 sessions per day.      Use CPM for 2 weeks or until you are told to stop.    Call MD / Call 911   Complete by:  As directed    If  you experience chest pain or shortness of breath, CALL 911 and be transported to the hospital emergency room.  If you develope a fever above 101 F, pus (white drainage) or increased drainage or redness at the wound, or calf pain, call your surgeon's office.   Change dressing   Complete by:  As directed    DO NOT REMOVE BANDAGE OVER SURGICAL INCISION.  [place] WHOLE LEG INCLUDING OVER THE WATERPROOF BANDAGE WITH SOAP AND WATER EVERY DAY.   Constipation Prevention   Complete by:  As directed    Drink plenty of fluids.  Prune juice may be helpful.  You may use a stool softener, such as Colace (over the counter) 100 mg twice a day.  Use MiraLax (over the counter) for constipation as needed.   Diet - low sodium heart healthy   Complete by:  As directed    Discharge instructions   Complete by:  As directed    INSTRUCTIONS AFTER JOINT REPLACEMENT   Remove items at home which could result in a fall. This includes throw rugs or furniture in walking pathways ICE to the affected joint every three hours while awake for 30 minutes at a time, for at least the first 3-5 days, and then as needed for pain and swelling.  Continue to use ice for pain and swelling. You may notice swelling that will progress down to the foot and ankle.  This is normal after surgery.  Elevate your leg when you are not up walking on it.   Continue to use the breathing machine you got in the hospital (incentive spirometer) which will help keep your temperature down.  It is common for your temperature to cycle up and down following surgery, especially at night when you are not up moving around and exerting yourself.  The breathing machine keeps your lungs expanded and your temperature down.   DIET:  As you were doing prior to hospitalization, we recommend a well-balanced diet.  DRESSING / WOUND CARE / SHOWERING  Keep the surgical dressing until follow up.  The dressing is water proof,  so you can shower without any extra covering.  IF THE DRESSING FALLS OFF or the wound gets wet inside, change the dressing with sterile gauze.  Please use good hand washing techniques before changing the dressing.  Do not use any lotions or creams on the incision until instructed by your surgeon.    ACTIVITY  Increase activity slowly as tolerated, but follow the weight bearing instructions below.   No driving for 6 weeks or until further direction given by your physician.  You cannot drive while taking narcotics.  No lifting or carrying greater than 10 lbs. until further directed by your surgeon. Avoid periods of inactivity such as sitting longer than an hour when not asleep. This helps prevent blood clots.  You may return to work once you are authorized by your doctor.     WEIGHT BEARING   Weight bearing as tolerated with assist device (walker, cane, etc) as directed, use it as long as suggested by your surgeon or therapist, typically at least 2-3 weeks.   EXERCISES  Results after joint replacement surgery are often greatly improved when you follow the exercise, range of motion and muscle strengthening exercises prescribed by your doctor. Safety measures are also important to protect the joint from further injury. Any time any of these exercises cause you to have increased pain or swelling, decrease what you are doing until you are comfortable again and then  slowly increase them. If you have problems or questions, call your caregiver or physical therapist for advice.   Rehabilitation is important following a joint replacement. After just a few days of immobilization, the muscles of the leg can become weakened and shrink (atrophy).  These exercises are designed to build up the tone and strength of the thigh and leg muscles and to improve motion. Often times heat used for twenty to thirty minutes before working out will loosen up your tissues and help with improving the range of motion but do not  use heat for the first two weeks following surgery (sometimes heat can increase post-operative swelling).   These exercises can be done on a training (exercise) mat, on the floor, on a table or on a bed. Use whatever works the best and is most comfortable for you.    Use music or television while you are exercising so that the exercises are a pleasant break in your day. This will make your life better with the exercises acting as a break in your routine that you can look forward to.   Perform all exercises about fifteen times, three times per day or as directed.  You should exercise both the operative leg and the other leg as well.   Exercises include:  Quad Sets - Tighten up the muscle on the front of the thigh (Quad) and hold for 5-10 seconds.   Straight Leg Raises - With your knee straight (if you were given a brace, keep it on), lift the leg to 60 degrees, hold for 3 seconds, and slowly lower the leg.  Perform this exercise against resistance later as your leg gets stronger.  Leg Slides: Lying on your back, slowly slide your foot toward your buttocks, bending your knee up off the floor (only go as far as is comfortable). Then slowly slide your foot back down until your leg is flat on the floor again.  Angel Wings: Lying on your back spread your legs to the side as far apart as you can without causing discomfort.  Hamstring Strength:  Lying on your back, push your heel against the floor with your leg straight by tightening up the muscles of your buttocks.  Repeat, but this time bend your knee to a comfortable angle, and push your heel against the floor.  You may put a pillow under the heel to make it more comfortable if necessary.   A rehabilitation program following joint replacement surgery can speed recovery and prevent re-injury in the future due to weakened muscles. Contact your doctor or a physical therapist for more information on knee rehabilitation.    CONSTIPATION  Constipation is  defined medically as fewer than three stools per week and severe constipation as less than one stool per week.  Even if you have a regular bowel pattern at home, your normal regimen is likely to be disrupted due to multiple reasons following surgery.  Combination of anesthesia, postoperative narcotics, change in appetite and fluid intake all can affect your bowels.   YOU MUST use at least one of the following options; they are listed in order of increasing strength to get the job done.  They are all available over the counter, and you may need to use some, POSSIBLY even all of these options:    Drink plenty of fluids (prune juice may be helpful) and high fiber foods Colace 100 mg by mouth twice a day  Senokot for constipation as directed and as needed Dulcolax (bisacodyl), take with  full glass of water  Miralax (polyethylene glycol) once or twice a day as needed.  If you have tried all these things and are unable to have a bowel movement in the first 3-4 days after surgery call either your surgeon or your primary doctor.    If you experience loose stools or diarrhea, hold the medications until you stool forms back up.  If your symptoms do not get better within 1 week or if they get worse, check with your doctor.  If you experience "the worst abdominal pain ever" or develop nausea or vomiting, please contact the office immediately for further recommendations for treatment.   ITCHING:  If you experience itching with your medications, try taking only a single pain pill, or even half a pain pill at a time.  You can also use Benadryl over the counter for itching or also to help with sleep.   TED HOSE STOCKINGS:  Use stockings on both legs until for at least 2 weeks or as directed by physician office. They may be removed at night for sleeping.  MEDICATIONS:  See your medication summary on the "After Visit Summary" that nursing will review with you.  You may have some home medications which will be placed  on hold until you complete the course of blood thinner medication.  It is important for you to complete the blood thinner medication as prescribed.  PRECAUTIONS:  If you experience chest pain or shortness of breath - call 911 immediately for transfer to the hospital emergency department.   If you develop a fever greater that 101 F, purulent drainage from wound, increased redness or drainage from wound, foul odor from the wound/dressing, or calf pain - CONTACT YOUR SURGEON.                                                   FOLLOW-UP APPOINTMENTS:  If you do not already have a post-op appointment, please call the office for an appointment to be seen by your surgeon.  Guidelines for how soon to be seen are listed in your "After Visit Summary", but are typically between 1-4 weeks after surgery.  OTHER INSTRUCTIONS:   Knee Replacement:  Do not place pillow under knee, focus on keeping the knee straight while resting. CPM instructions: 0-90 degrees, 2 hours in the morning, 2 hours in the afternoon, and 2 hours in the evening. Place foam block, curve side up under heel at all times except when in CPM or when walking.  DO NOT modify, tear, cut, or change the foam block in any way.  MAKE SURE YOU:  Understand these instructions.  Get help right away if you are not doing well or get worse.    Thank you for letting us be a part of your medical care team.  It is a privilege we respect greatly.  We hope these instructions will help you stay on track for a fast and full recovery!   Do not put a pillow under the knee. Place it under the heel.   Complete by:  As directed    Place gray foam block, curve side up under heel at all times except when in CPM or when walking.  DO NOT modify, tear, cut, or change in any way the gray foam block.   Increase activity slowly as tolerated   Complete by:  As directed    Patient may shower   Complete by:  As directed    Aquacel dressing is water proof    Wash over it and  the whole leg with soap and water at the end of your shower   TED hose   Complete by:  As directed    Use stockings (TED hose) for 2 weeks on both leg(s).  You may remove them at night for sleeping.      Follow-up Information    Elsie Saas, MD Follow up on 10/26/2017.   Specialty:  Orthopedic Surgery Why:  as scheduled Contact information: [place] [address] [phone]        [facility], [facility] Follow up.   Specialty:  [place] Why:  [place] office will call to arrange initial visit Contact information: [place] [address] [phone]            Signed: Linda Hedges 10/13/2017, 1:23 PM

## 2017-10-13 NOTE — Evaluation (Signed)
Occupational Therapy Evaluation Patient Details Name: [name] MRN: [id number] DOB: June 12, 1956 Today's Date: 10/13/2017    History of Present Illness Pt is a 61 y/o female s/p elective L TKA. PMH includes asthma, OSA on CPAP, depression, back surgery, and L knee arthroscopy.    Clinical Impression   Pt moving well. Educated in fall prevention, multiple uses of 3 in 1, compensatory strategies for LB ADL and AE use and how to transport items safely with RW. Pt verbalizing understanding of all information. No further OT needs.    Follow Up Recommendations  No OT follow up    Equipment Recommendations  3 in 1 bedside commode    Recommendations for Other Services       Precautions / Restrictions Precautions Precautions: Knee Precaution Booklet Issued: Yes (comment) Precaution Comments: Reviewed supine ther ex with pt.  Restrictions Weight Bearing Restrictions: Yes LLE Weight Bearing: Weight bearing as tolerated      Mobility Bed Mobility   Bed Mobility: Sit to Supine       Sit to supine: Modified independent (Device/Increase time)   General bed mobility comments: no assist to return to bed  Transfers Overall transfer level: Needs assistance Equipment used: Rolling walker (2 wheeled) Transfers: Sit to/from Stand Sit to Stand: Supervision         General transfer comment: cues for hand placement with sit to stand    Balance Overall balance assessment: Needs assistance Sitting-balance support: No upper extremity supported;Feet supported Sitting balance-Leahy Scale: Good     Standing balance support: Bilateral upper extremity supported;During functional activity Standing balance-Leahy Scale: Fair Standing balance comment: able to release walker at sink                           ADL either performed or assessed with clinical judgement   ADL Overall ADL's : Needs assistance/impaired Eating/Feeding: Independent;Sitting   Grooming:  Standing;Oral care;Supervision/safety   Upper Body Bathing: Set up;Sitting   Lower Body Bathing: With adaptive equipment;Sit to/from stand   Upper Body Dressing : Set up;Sitting   Lower Body Dressing: Minimal assistance;Sit to/from stand   Toilet Transfer: Banker Details (indicate cue type and reason): simulated Toileting- Clothing Manipulation and Hygiene: Sit to/from Social worker Details (indicate cue type and reason): educated in technique and use of 3 in 1 is shower seat is not optimal   General ADL Comments: Educated pt in compensatory strategies for ADL and use of AE. Instructed in how to transport items safely with RW.      Vision Patient Visual Report: No change from baseline       Perception     Praxis      Pertinent Vitals/Pain Pain Assessment: Faces Faces Pain Scale: Hurts little more Pain Location: L knee  Pain Descriptors / Indicators: Aching;Throbbing;Operative site guarding Pain Intervention(s): Monitored during session;Premedicated before session;Repositioned     Hand Dominance Right   Extremity/Trunk Assessment Upper Extremity Assessment Upper Extremity Assessment: Overall WFL for tasks assessed   Lower Extremity Assessment Lower Extremity Assessment: Defer to PT evaluation   Cervical / Trunk Assessment Cervical / Trunk Assessment: Normal   Communication Communication Communication: No difficulties   Cognition Arousal/Alertness: Awake/alert Behavior During Therapy: WFL for tasks assessed/performed Overall Cognitive Status: Within Functional Limits for tasks assessed  General Comments      Exercises    Shoulder Instructions      Home Living Family/patient expects to be discharged to:: Private residence Living Arrangements: Spouse/significant other Available Help at Discharge: Family;Available 24  hours/day Type of Home: House Home Access: Stairs to enter Technical brewer of Steps: 1(threshold)   Home Layout: One level     Bathroom Shower/Tub: Occupational psychologist: Standard     Home Equipment: Shower seat - built in          Prior Functioning/Environment Level of Independence: Independent                 OT Problem List: Impaired balance (sitting and/or standing)      OT Treatment/Interventions:      OT Goals(Current goals can be found in the care plan section) Acute Rehab OT Goals Patient Stated Goal: to go home   OT Frequency:     Barriers to D/C:            Co-evaluation              AM-PAC PT "6 Clicks" Daily Activity     Outcome Measure Help from another person eating meals?: None Help from another person taking care of personal grooming?: A Little Help from another person toileting, which includes using toliet, bedpan, or urinal?: A Little Help from another person bathing (including washing, rinsing, drying)?: A Little Help from another person to put on and taking off regular upper body clothing?: None Help from another person to put on and taking off regular lower body clothing?: A Little 6 Click Score: 20   End of Session Equipment Utilized During Treatment: Gait belt;Rolling walker CPM Left Knee CPM Left Knee: On  Activity Tolerance: Patient tolerated treatment well Patient left: in chair;with call bell/phone within reach  OT Visit Diagnosis: Pain Pain - Right/Left: Left Pain - part of body: Knee                Time: 7262-0355 OT Time Calculation (min): 28 min Charges:  OT General Charges $OT Visit: 1 Visit OT Evaluation $OT Eval Low Complexity: 1 Low OT Treatments $Self Care/Home Management : 8-22 mins G-Codes:     Malka So 10/13/2017, 10:30 AM  10/13/2017 Nestor Lewandowsky, OTR/L Pager: [phone]

## 2017-10-14 ENCOUNTER — Other Ambulatory Visit: Payer: Self-pay

## 2017-10-14 NOTE — Patient Outreach (Signed)
[facility]) Care Management  10/14/2017   [name] [birth year] [id number]  Telephone call for transition of care call.  Member was hospitalized for left total knee arthoplasty on 10/12/17 and discharged on 10/13/17.  Subjective: Member states that she has been doing good since coming home.  States her husband has been assisting her and making sure she is getting up and moving.  States she has her CPM machine, rolling walker and the bedside commode.  States she has been using her incentive spirometer.  States her pain is being controlled with her pain medication which she is alternating with plain Tylenol.  States her dressing has no drainage.  States she is taking her bowel medications and feels like they are going to work soon.  States she has number to contact [facility] if she does not hear from them later today.  States that she has an appointment to see her surgeon on 10/26/17.  Objective: S/p left knee arthoplasty, hx of asthma and obstructive sleep apnea  Current Medications:  Current Outpatient Medications  Medication Sig Dispense Refill  . acetaminophen (TYLENOL) 325 MG tablet Take 2 tablets (650 mg total) every 4 (four) hours as needed by mouth for mild pain ((score 1 to 3) or temp > 100.5).    Marland Kitchen ADVAIR DISKUS 100-50 MCG/DOSE AEPB INHALE 1 PUFF INTO THE LUNGS 2 TIMES DAILY. RINSE MOUTH 180 each 3  . Albuterol Sulfate (PROAIR RESPICLICK) 706 (90 Base) MCG/ACT AEPB Inhale 2 puffs into the lungs 4 (four) times daily as needed. (Patient taking differently: Inhale 2 puffs into the lungs 4 (four) times daily as needed (Shortness of breath/wheezing). ) 1 each 0  . ARIPiprazole (ABILIFY) 2 MG tablet Take 2 mg by mouth daily.    Marland Kitchen aspirin EC 325 MG EC tablet 1 tab a day for the next 30 days to prevent blood clots 30 tablet 0  . Cholecalciferol (VITAMIN D) 2000 units tablet Take 2,000 Units by mouth daily.    . clonazePAM (KLONOPIN) 1 MG tablet Take 1 mg by mouth 2 (two)  times daily.    Marland Kitchen desvenlafaxine (PRISTIQ) 100 MG 24 hr tablet Take 100 mg by mouth daily.    Marland Kitchen docusate sodium (COLACE) 100 MG capsule 1 tab 2 times a day while on narcotics.  STOOL SOFTENER 60 capsule 0  . fexofenadine (ALLEGRA) 180 MG tablet Take 180 mg by mouth daily.      Marland Kitchen omeprazole (PRILOSEC) 20 MG capsule TAKE 1 CAPSULE BY MOUTH DAILY 90 capsule 0  . oxyCODONE 10 MG TABS 1 po q 4 hrs prn pain 40 tablet 0  . polyethylene glycol (MIRALAX / GLYCOLAX) packet Take 17 g 2 (two) times daily by mouth. 14 each 0  . Probiotic Product (PROBIOTIC ADVANCED PO) Take 1 tablet by mouth daily.    Marland Kitchen topiramate (TOPAMAX) 100 MG tablet TAKE 2 TABLETS BY MOUTH AT BEDTIME. 180 tablet 0   No current facility-administered medications for this visit.     Functional Status:  In your present state of health, do you have any difficulty performing the following activities: 10/13/2017 10/02/2017  Hearing? - N  Vision? - N  Difficulty concentrating or making decisions? - N  Walking or climbing stairs? - Y  Comment - related to knee pain  Dressing or bathing? - N  Doing errands, shopping? Y -  Some recent data might be hidden    Fall/Depression Screening: Fall Risk  09/27/2015 06/15/2015  Falls in the past year?  No No   PHQ 2/9 Scores 05/21/2017 09/27/2015 06/15/2015  PHQ - 2 Score 0 0 0  PHQ- 9 Score 0 - -    Assessment: S/p left knee arthoplasty, hx of asthma and obstructive sleep apnea.  Member voices knowledge of s/s to contact provider.  Member to have home health PT through [facility] [place].  Member has CPM machine, rolling walker and 3:1 bedside commode.  Member has follow up appointment with surgeon on 11/126/18.  Member has  [employer].   Plan:  Transition of care call completed.  Reviewed discharge instructions and s/s to call provider Patient was recently discharged from hospital and all medications have been reviewed. Instructed to call Alvis Lemmings at the [facility] later today if she has not been  contacted.  Member assessed with no further case management needs identified and case closed Successful outreach letter to be sent. Peter Garter RN, [facility] Care Management Coordinator-Link to [place] Management [phone]

## 2017-10-15 DIAGNOSIS — J4530 Mild persistent asthma, uncomplicated: Secondary | ICD-10-CM | POA: Diagnosis not present

## 2017-10-15 DIAGNOSIS — F419 Anxiety disorder, unspecified: Secondary | ICD-10-CM | POA: Diagnosis not present

## 2017-10-15 DIAGNOSIS — Z471 Aftercare following joint replacement surgery: Secondary | ICD-10-CM | POA: Diagnosis not present

## 2017-10-15 DIAGNOSIS — F329 Major depressive disorder, single episode, unspecified: Secondary | ICD-10-CM | POA: Diagnosis not present

## 2017-10-15 DIAGNOSIS — G4733 Obstructive sleep apnea (adult) (pediatric): Secondary | ICD-10-CM | POA: Diagnosis not present

## 2017-10-20 DIAGNOSIS — J4530 Mild persistent asthma, uncomplicated: Secondary | ICD-10-CM | POA: Diagnosis not present

## 2017-10-20 DIAGNOSIS — Z471 Aftercare following joint replacement surgery: Secondary | ICD-10-CM | POA: Diagnosis not present

## 2017-10-20 DIAGNOSIS — F419 Anxiety disorder, unspecified: Secondary | ICD-10-CM | POA: Diagnosis not present

## 2017-10-20 DIAGNOSIS — F329 Major depressive disorder, single episode, unspecified: Secondary | ICD-10-CM | POA: Diagnosis not present

## 2017-10-20 DIAGNOSIS — G4733 Obstructive sleep apnea (adult) (pediatric): Secondary | ICD-10-CM | POA: Diagnosis not present

## 2017-10-21 ENCOUNTER — Other Ambulatory Visit: Payer: Self-pay | Admitting: Internal Medicine

## 2017-10-23 ENCOUNTER — Other Ambulatory Visit (HOSPITAL_COMMUNITY): Payer: Self-pay

## 2017-10-23 DIAGNOSIS — Z471 Aftercare following joint replacement surgery: Secondary | ICD-10-CM | POA: Diagnosis not present

## 2017-10-23 DIAGNOSIS — G4733 Obstructive sleep apnea (adult) (pediatric): Secondary | ICD-10-CM | POA: Diagnosis not present

## 2017-10-23 DIAGNOSIS — J4530 Mild persistent asthma, uncomplicated: Secondary | ICD-10-CM | POA: Diagnosis not present

## 2017-10-23 DIAGNOSIS — F329 Major depressive disorder, single episode, unspecified: Secondary | ICD-10-CM | POA: Diagnosis not present

## 2017-10-23 DIAGNOSIS — F419 Anxiety disorder, unspecified: Secondary | ICD-10-CM | POA: Diagnosis not present

## 2017-10-26 DIAGNOSIS — M1712 Unilateral primary osteoarthritis, left knee: Secondary | ICD-10-CM | POA: Diagnosis not present

## 2017-10-28 DIAGNOSIS — M25562 Pain in left knee: Secondary | ICD-10-CM | POA: Diagnosis not present

## 2017-10-28 DIAGNOSIS — Z471 Aftercare following joint replacement surgery: Secondary | ICD-10-CM | POA: Diagnosis not present

## 2017-10-30 DIAGNOSIS — M25562 Pain in left knee: Secondary | ICD-10-CM | POA: Diagnosis not present

## 2017-10-30 DIAGNOSIS — Z471 Aftercare following joint replacement surgery: Secondary | ICD-10-CM | POA: Diagnosis not present

## 2017-11-05 DIAGNOSIS — Z471 Aftercare following joint replacement surgery: Secondary | ICD-10-CM | POA: Diagnosis not present

## 2017-11-05 DIAGNOSIS — M25562 Pain in left knee: Secondary | ICD-10-CM | POA: Diagnosis not present

## 2017-11-11 DIAGNOSIS — Z471 Aftercare following joint replacement surgery: Secondary | ICD-10-CM | POA: Diagnosis not present

## 2017-11-11 DIAGNOSIS — M25562 Pain in left knee: Secondary | ICD-10-CM | POA: Diagnosis not present

## 2017-11-13 DIAGNOSIS — M25562 Pain in left knee: Secondary | ICD-10-CM | POA: Diagnosis not present

## 2017-11-13 DIAGNOSIS — Z471 Aftercare following joint replacement surgery: Secondary | ICD-10-CM | POA: Diagnosis not present

## 2017-11-17 ENCOUNTER — Other Ambulatory Visit: Payer: Self-pay | Admitting: Internal Medicine

## 2017-11-17 DIAGNOSIS — M25562 Pain in left knee: Secondary | ICD-10-CM | POA: Diagnosis not present

## 2017-11-17 DIAGNOSIS — J4530 Mild persistent asthma, uncomplicated: Secondary | ICD-10-CM

## 2017-11-17 DIAGNOSIS — Z471 Aftercare following joint replacement surgery: Secondary | ICD-10-CM | POA: Diagnosis not present

## 2017-11-19 ENCOUNTER — Encounter: Payer: Self-pay | Admitting: Internal Medicine

## 2017-11-19 ENCOUNTER — Ambulatory Visit (INDEPENDENT_AMBULATORY_CARE_PROVIDER_SITE_OTHER): Payer: 59 | Admitting: Internal Medicine

## 2017-11-19 VITALS — BP 124/84 | HR 90 | Temp 98.1°F | Wt 216.0 lb

## 2017-11-19 DIAGNOSIS — K580 Irritable bowel syndrome with diarrhea: Secondary | ICD-10-CM | POA: Diagnosis not present

## 2017-11-19 DIAGNOSIS — M1712 Unilateral primary osteoarthritis, left knee: Secondary | ICD-10-CM | POA: Diagnosis not present

## 2017-11-19 MED ORDER — DICYCLOMINE HCL 10 MG PO CAPS: 10.0000 mg | ORAL_CAPSULE | Freq: Three times a day (TID) | ORAL | 0 refills | Status: AC

## 2017-11-19 NOTE — Progress Notes (Signed)
Subjective:    Patient ID: [name], female    DOB: [birth year], 61 y.o.   MRN: [id number]  HPI   Pt presents to the clinic today with c/o loose stools. This started 4 weeks ago. She is having 4-6 loose stools a day. She reports associated abdominal cramping mostly before BM. She denies blood in her stool. She denies recent changes in diet or medication. She had a knee replacement about 1 month ago, received IV abx intraoperatively, but none post op. She has been under a lot of stress with her job and loneliness since surgery. She reports the symptoms improve when her husband gets home in the afternoon. She denies recent travel. Colonoscopy from 01/2009 reviewed.     Review of Systems  Past Medical History:  Diagnosis Date  . Acute medial meniscus tear of left knee   . Allergic rhinitis   . Angioedema 1999  . Anxiety   . Arthritis   . Asthma   . Depression   . GERD (gastroesophageal reflux disease)   . Headache    migraines  . Primary localized osteoarthritis of left knee   . Sleep apnea    uses CPAP nightly  . Urticaria 1999    Current Outpatient Medications  Medication Sig Dispense Refill  . acetaminophen (TYLENOL) 325 MG tablet Take 2 tablets (650 mg total) every 4 (four) hours as needed by mouth for mild pain ((score 1 to 3) or temp > 100.5).    Marland Kitchen ADVAIR DISKUS 100-50 MCG/DOSE AEPB INHALE 1 PUFF INTO THE LUNGS 2 TIMES DAILY. RINSE MOUTH 180 each 3  . Albuterol Sulfate (PROAIR RESPICLICK) 970 (90 Base) MCG/ACT AEPB Inhale 2 puffs into the lungs 4 (four) times daily as needed. (Patient taking differently: Inhale 2 puffs into the lungs 4 (four) times daily as needed (Shortness of breath/wheezing). ) 1 each 0  . ARIPiprazole (ABILIFY) 2 MG tablet Take 2 mg by mouth daily.    Marland Kitchen aspirin EC 325 MG EC tablet 1 tab a day for the next 30 days to prevent blood clots 30 tablet 0  . Cholecalciferol (VITAMIN D) 2000 units tablet Take 2,000 Units by mouth daily.    . clonazePAM  (KLONOPIN) 1 MG tablet Take 1 mg by mouth 2 (two) times daily.    Marland Kitchen desvenlafaxine (PRISTIQ) 100 MG 24 hr tablet Take 100 mg by mouth daily.    Marland Kitchen docusate sodium (COLACE) 100 MG capsule 1 tab 2 times a day while on narcotics.  STOOL SOFTENER 60 capsule 0  . fexofenadine (ALLEGRA) 180 MG tablet Take 180 mg by mouth daily.      Marland Kitchen omeprazole (PRILOSEC) 20 MG capsule TAKE 1 CAPSULE BY MOUTH DAILY 90 capsule 1  . oxyCODONE 10 MG TABS 1 po q 4 hrs prn pain 40 tablet 0  . polyethylene glycol (MIRALAX / GLYCOLAX) packet Take 17 g 2 (two) times daily by mouth. 14 each 0  . Probiotic Product (PROBIOTIC ADVANCED PO) Take 1 tablet by mouth daily.    Marland Kitchen topiramate (TOPAMAX) 100 MG tablet TAKE 2 TABLETS BY MOUTH AT BEDTIME. 180 tablet 0   No current facility-administered medications for this visit.     No Known Allergies  Family History  Problem Relation Age of Onset  . COPD Father   . Arthritis Father   . Emphysema Father   . Sleep apnea Father   . Heart disease Mother   . COPD Unknown        sibling   .  Arthritis Unknown        sibling    Social History   Socioeconomic History  . Marital status: Married    Spouse name: Not on file  . Number of children: Not on file  . Years of education: Not on file  . Highest education level: Not on file  Social Needs  . Financial resource strain: Not on file  . Food insecurity - worry: Not on file  . Food insecurity - inability: Not on file  . Transportation needs - medical: Not on file  . Transportation needs - non-medical: Not on file  Occupational History  . Occupation: Therapist, sports  Tobacco Use  . Smoking status: Never Smoker  . Smokeless tobacco: Never Used  Substance and Sexual Activity  . Alcohol use: Yes    Alcohol/week: 1.2 oz    Types: 2 Glasses of wine per week    Comment: social  . Drug use: No  . Sexual activity: Yes    Birth control/protection: Surgical, Post-menopausal  Other Topics Concern  . Not on file  Social History Narrative     Positive history of passive tobacco smoke exposure.   Works at [employer] as Therapist, sports in Optician, dispensing     Constitutional: Denies fever, malaise, fatigue, headache or abrupt weight changes.  HEENT: Denies eye pain, eye redness, ear pain, ringing in the ears, wax buildup, runny nose, nasal congestion, bloody nose, or sore throat. Respiratory: Denies difficulty breathing, shortness of breath, cough or sputum production.   Cardiovascular: Denies chest pain, chest tightness, palpitations or swelling in the hands or feet.  Gastrointestinal: Pt reports abdominal cramping and loose stools. Denies abdominal pain, bloating, constipation, or blood in the stool.    No other specific complaints in a complete review of systems (except as listed in HPI above).     Objective:   Physical Exam   BP 124/84   Pulse 90   Temp 98.1 F (36.7 C) (Oral)   Wt 216 lb (98 kg)   LMP 12/17/2011   SpO2 97%   BMI 32.84 kg/m  Wt Readings from Last 3 Encounters:  11/19/17 216 lb (98 kg)  10/02/17 218 lb (98.9 kg)  06/04/17 213 lb 8 oz (96.8 kg)    General: Appears her stated age, well developed, well nourished in NAD. Cardiovascular: Normal rate and rhythm.  Pulmonary/Chest: Normal effort and positive vesicular breath sounds. No respiratory distress. No wheezes, rales or ronchi noted.  Abdomen: Soft and generally tender. Hyperactive bowel sounds. No distention or masses noted.   BMET    Component Value Date/Time   NA 138 10/13/2017 0535   K 4.0 10/13/2017 0535   CL 108 10/13/2017 0535   CO2 23 10/13/2017 0535   GLUCOSE 154 (H) 10/13/2017 0535   BUN 10 10/13/2017 0535   CREATININE 0.72 10/13/2017 0535   CREATININE 0.93 06/15/2015 1620   CALCIUM 8.8 (L) 10/13/2017 0535   GFRNONAA >60 10/13/2017 0535   GFRAA >60 10/13/2017 0535    Lipid Panel     Component Value Date/Time   CHOL 219 (H) 05/21/2017 1519   TRIG 127.0 05/21/2017 1519   HDL 66.90 05/21/2017 1519   CHOLHDL 3 05/21/2017 1519   VLDL 25.4  05/21/2017 1519   LDLCALC 127 (H) 05/21/2017 1519    CBC    Component Value Date/Time   WBC 15.5 (H) 10/13/2017 0535   RBC 3.80 (L) 10/13/2017 0535   HGB 11.1 (L) 10/13/2017 0535   HCT 34.7 (L) 10/13/2017 0535  PLT 294 10/13/2017 0535   MCV 91.3 10/13/2017 0535   MCH 29.2 10/13/2017 0535   MCHC 32.0 10/13/2017 0535   RDW 13.6 10/13/2017 0535   LYMPHSABS 2.4 10/02/2017 1524   MONOABS 0.4 10/02/2017 1524   EOSABS 0.0 10/02/2017 1524   BASOSABS 0.0 10/02/2017 1524    Hgb A1C Lab Results  Component Value Date   HGBA1C 5.6 09/27/2015           Assessment & Plan:   IBS, Mainly Diarrhea:  Handout given on FODMAP diet Discussed stress relieving techniques eRx for Bentyl 10 mg 4 x day as needed for cramping You can use Imodium OTC, but avoid overuse as this can lead to constipation Encouraged her to stay well hydrated  Return precautions discussed Webb Silversmith, NP

## 2017-11-20 ENCOUNTER — Encounter: Payer: Self-pay | Admitting: Internal Medicine

## 2017-11-20 DIAGNOSIS — M25562 Pain in left knee: Secondary | ICD-10-CM | POA: Diagnosis not present

## 2017-11-20 DIAGNOSIS — Z471 Aftercare following joint replacement surgery: Secondary | ICD-10-CM | POA: Diagnosis not present

## 2017-11-20 DIAGNOSIS — K580 Irritable bowel syndrome with diarrhea: Secondary | ICD-10-CM | POA: Diagnosis not present

## 2017-11-20 NOTE — Patient Instructions (Signed)
Diet for Irritable Bowel Syndrome When you have irritable bowel syndrome (IBS), the foods you eat and your eating habits are very important. IBS may cause various symptoms, such as abdominal pain, constipation, or diarrhea. Choosing the right foods can help ease discomfort caused by these symptoms. Work with your health care provider and dietitian to find the best eating plan to help control your symptoms. What general guidelines do I need to follow?  Keep a food diary. This will help you identify foods that cause symptoms. Write down: ? What you eat and when. ? What symptoms you have. ? When symptoms occur in relation to your meals.  Avoid foods that cause symptoms. Talk with your dietitian about other ways to get the same nutrients that are in these foods.  Eat more foods that contain fiber. Take a fiber supplement if directed by your dietitian.  Eat your meals slowly, in a relaxed setting.  Aim to eat 5-6 small meals per day. Do not skip meals.  Drink enough fluids to keep your urine clear or pale yellow.  Ask your health care provider if you should take an over-the-counter probiotic during flare-ups to help restore healthy gut bacteria.  If you have cramping or diarrhea, try making your meals low in fat and high in carbohydrates. Examples of carbohydrates are pasta, rice, whole grain breads and cereals, fruits, and vegetables.  If dairy products cause your symptoms to flare up, try eating less of them. You might be able to handle yogurt better than other dairy products because it contains bacteria that help with digestion. What foods are not recommended? The following are some foods and drinks that may worsen your symptoms:  Fatty foods, such as French fries.  Milk products, such as cheese or ice cream.  Chocolate.  Alcohol.  Products with caffeine, such as coffee.  Carbonated drinks, such as soda.  The items listed above may not be a complete list of foods and beverages to  avoid. Contact your dietitian for more information. What foods are good sources of fiber? Your health care provider or dietitian may recommend that you eat more foods that contain fiber. Fiber can help reduce constipation and other IBS symptoms. Add foods with fiber to your diet a little at a time so that your body can get used to them. Too much fiber at once might cause gas and swelling of your abdomen. The following are some foods that are good sources of fiber:  Apples.  Peaches.  Pears.  Berries.  Figs.  Broccoli (raw).  Cabbage.  Carrots.  Raw peas.  Kidney beans.  Lima beans.  Whole grain bread.  Whole grain cereal.  Where to find more information: International Foundation for Functional Gastrointestinal Disorders: www.iffgd.org National Institute of Diabetes and Digestive and Kidney Diseases: www.niddk.nih.gov/health-information/health-topics/digestive-diseases/ibs/Pages/facts.aspx This information is not intended to replace advice given to you by your health care provider. Make sure you discuss any questions you have with your health care provider. Document Released: 02/07/2004 Document Revised: 04/24/2016 Document Reviewed: 02/17/2014 Elsevier Interactive Patient Education  2018 Elsevier Inc.  

## 2017-11-25 DIAGNOSIS — M25562 Pain in left knee: Secondary | ICD-10-CM | POA: Diagnosis not present

## 2017-11-25 DIAGNOSIS — Z471 Aftercare following joint replacement surgery: Secondary | ICD-10-CM | POA: Diagnosis not present

## 2017-11-29 DIAGNOSIS — Z760 Encounter for issue of repeat prescription: Secondary | ICD-10-CM | POA: Diagnosis not present

## 2017-12-04 DIAGNOSIS — Z471 Aftercare following joint replacement surgery: Secondary | ICD-10-CM | POA: Diagnosis not present

## 2017-12-04 DIAGNOSIS — M25562 Pain in left knee: Secondary | ICD-10-CM | POA: Diagnosis not present

## 2017-12-08 DIAGNOSIS — G4733 Obstructive sleep apnea (adult) (pediatric): Secondary | ICD-10-CM | POA: Diagnosis not present

## 2017-12-10 DIAGNOSIS — M25562 Pain in left knee: Secondary | ICD-10-CM | POA: Diagnosis not present

## 2017-12-10 DIAGNOSIS — Z471 Aftercare following joint replacement surgery: Secondary | ICD-10-CM | POA: Diagnosis not present

## 2017-12-18 DIAGNOSIS — Z471 Aftercare following joint replacement surgery: Secondary | ICD-10-CM | POA: Diagnosis not present

## 2017-12-18 DIAGNOSIS — M25562 Pain in left knee: Secondary | ICD-10-CM | POA: Diagnosis not present

## 2017-12-24 DIAGNOSIS — Z471 Aftercare following joint replacement surgery: Secondary | ICD-10-CM | POA: Diagnosis not present

## 2017-12-24 DIAGNOSIS — M25562 Pain in left knee: Secondary | ICD-10-CM | POA: Diagnosis not present

## 2017-12-28 DIAGNOSIS — F332 Major depressive disorder, recurrent severe without psychotic features: Secondary | ICD-10-CM | POA: Diagnosis not present

## 2017-12-31 DIAGNOSIS — M1712 Unilateral primary osteoarthritis, left knee: Secondary | ICD-10-CM | POA: Diagnosis not present

## 2018-01-05 ENCOUNTER — Ambulatory Visit
Admission: RE | Admit: 2018-01-05 | Discharge: 2018-01-05 | Disposition: A | Discharge status: Home / Self Care | Payer: 59 | Source: Ambulatory Visit | Attending: Internal Medicine | Admitting: Internal Medicine

## 2018-01-05 DIAGNOSIS — Z1231 Encounter for screening mammogram for malignant neoplasm of breast: Secondary | ICD-10-CM | POA: Diagnosis not present

## 2018-01-14 ENCOUNTER — Other Ambulatory Visit: Payer: Self-pay | Admitting: Internal Medicine

## 2018-01-14 DIAGNOSIS — G43839 Menstrual migraine, intractable, without status migrainosus: Secondary | ICD-10-CM

## 2018-01-14 NOTE — Telephone Encounter (Signed)
Last filled 07/20/2017... Last OV 11/2015 and CPE not due until June 2019

## 2018-01-18 ENCOUNTER — Telehealth: Payer: Self-pay | Admitting: Internal Medicine

## 2018-01-18 NOTE — Telephone Encounter (Signed)
Copied from [place]. Topic: Quick Communication - Rx Refill/Question >> Jan 18, 2018  2:18 PM Scherrie Gerlach wrote: Medication: Valtrex 1000 mg.   Pt states she has now gotten shingles for the 3rd time on her buttocks. Pt saw Dr Sarajane Jews in 05/2017 and he prescribed this med.  Pt states it is the same thing again. Would like to know if Rollene Fare will call in a script for this  [place], [place] [phone] (Phone) [phone] (Fax)

## 2018-01-18 NOTE — Telephone Encounter (Signed)
I spoke with pt and scheduled appt for pt to see R Baity NP on 01/19/18 at 8AM  Pt will come 10 mins early to register at front desk.

## 2018-01-19 ENCOUNTER — Ambulatory Visit: Payer: 59 | Admitting: Internal Medicine

## 2018-01-19 ENCOUNTER — Encounter: Payer: Self-pay | Admitting: Internal Medicine

## 2018-01-19 VITALS — BP 126/84 | HR 85 | Temp 98.1°F | Wt 216.0 lb

## 2018-01-19 DIAGNOSIS — J069 Acute upper respiratory infection, unspecified: Secondary | ICD-10-CM

## 2018-01-19 DIAGNOSIS — B9789 Other viral agents as the cause of diseases classified elsewhere: Secondary | ICD-10-CM

## 2018-01-19 DIAGNOSIS — B029 Zoster without complications: Secondary | ICD-10-CM

## 2018-01-19 MED ORDER — VALACYCLOVIR HCL 1 G PO TABS: 1000.0000 mg | ORAL_TABLET | Freq: Three times a day (TID) | ORAL | 0 refills | Status: AC

## 2018-01-19 NOTE — Patient Instructions (Signed)

## 2018-01-19 NOTE — Progress Notes (Signed)
Subjective:    Patient ID: [name], female    DOB: [birth year], 62 y.o.   MRN: [id number]  HPI  Pt presents to the clinic today with c/o a rash on her buttocks. She noticed this 2 days ago. The rash tingles and burns. It has not spread. She reports she has had shingles x 2 in the same exact spot and she thinks this is what this rash is as well. She has not tried anything OTC for her symptoms.  She is also c/o runny nose, cough and wheezing. This started 1-2 days ago. She is blowing clear mucous out of her nose. The cough is non productive. She denies fever, chills or body aches. She has not taken anything OTC for her symptoms. She does have a history of asthma. She has not had sick contacts.  Review of Systems      Past Medical History:  Diagnosis Date  . Acute medial meniscus tear of left knee   . Allergic rhinitis   . Angioedema 1999  . Anxiety   . Arthritis   . Asthma   . Depression   . GERD (gastroesophageal reflux disease)   . Headache    migraines  . Primary localized osteoarthritis of left knee   . Sleep apnea    uses CPAP nightly  . Urticaria 1999    Current Outpatient Medications  Medication Sig Dispense Refill  . acetaminophen (TYLENOL) 325 MG tablet Take 2 tablets (650 mg total) every 4 (four) hours as needed by mouth for mild pain ((score 1 to 3) or temp > 100.5).    Marland Kitchen ADVAIR DISKUS 100-50 MCG/DOSE AEPB INHALE 1 PUFF INTO THE LUNGS 2 TIMES DAILY. RINSE MOUTH 180 each 1  . Albuterol Sulfate (PROAIR RESPICLICK) 440 (90 Base) MCG/ACT AEPB Inhale 2 puffs into the lungs 4 (four) times daily as needed. (Patient taking differently: Inhale 2 puffs into the lungs 4 (four) times daily as needed (Shortness of breath/wheezing). ) 1 each 0  . ARIPiprazole (ABILIFY) 2 MG tablet Take 2 mg by mouth daily.    Marland Kitchen aspirin EC 325 MG EC tablet 1 tab a day for the next 30 days to prevent blood clots 30 tablet 0  . Cholecalciferol (VITAMIN D) 2000 units tablet Take 2,000 Units by  mouth daily.    . clonazePAM (KLONOPIN) 1 MG tablet Take 1 mg by mouth 2 (two) times daily.    Marland Kitchen desvenlafaxine (PRISTIQ) 100 MG 24 hr tablet Take 100 mg by mouth daily.    Marland Kitchen dicyclomine (BENTYL) 10 MG capsule Take 1 capsule (10 mg total) by mouth 4 (four) times daily -  before meals and at bedtime. 60 capsule 0  . docusate sodium (COLACE) 100 MG capsule 1 tab 2 times a day while on narcotics.  STOOL SOFTENER (Patient not taking: Reported on 11/19/2017) 60 capsule 0  . fexofenadine (ALLEGRA) 180 MG tablet Take 180 mg by mouth daily.      Marland Kitchen omeprazole (PRILOSEC) 20 MG capsule TAKE 1 CAPSULE BY MOUTH DAILY 90 capsule 1  . oxyCODONE 10 MG TABS 1 po q 4 hrs prn pain 40 tablet 0  . polyethylene glycol (MIRALAX / GLYCOLAX) packet Take 17 g 2 (two) times daily by mouth. 14 each 0  . Probiotic Product (PROBIOTIC ADVANCED PO) Take 1 tablet by mouth daily.    Marland Kitchen topiramate (TOPAMAX) 100 MG tablet TAKE 2 TABLETS BY MOUTH AT BEDTIME. 180 tablet 1   No current facility-administered medications for this  visit.     No Known Allergies  Family History  Problem Relation Age of Onset  . COPD Father   . Arthritis Father   . Emphysema Father   . Sleep apnea Father   . Heart disease Mother   . COPD Unknown        sibling   . Arthritis Unknown        sibling  . Breast cancer Sister     Social History   Socioeconomic History  . Marital status: Married    Spouse name: Not on file  . Number of children: Not on file  . Years of education: Not on file  . Highest education level: Not on file  Social Needs  . Financial resource strain: Not on file  . Food insecurity - worry: Not on file  . Food insecurity - inability: Not on file  . Transportation needs - medical: Not on file  . Transportation needs - non-medical: Not on file  Occupational History  . Occupation: Therapist, sports  Tobacco Use  . Smoking status: Never Smoker  . Smokeless tobacco: Never Used  Substance and Sexual Activity  . Alcohol use: Yes     Alcohol/week: 1.2 oz    Types: 2 Glasses of wine per week    Comment: social  . Drug use: No  . Sexual activity: Yes    Birth control/protection: Surgical, Post-menopausal  Other Topics Concern  . Not on file  Social History Narrative   Positive history of passive tobacco smoke exposure.   Works at [employer] as Therapist, sports in [employer], dispensing     Constitutional: Denies fever, malaise, fatigue, headache or abrupt weight changes.  HEENT: Pt reports runny nose. Denies ear pain, nasal congestion or sore throat. Respiratory: Pt reports cough. Denies difficulty breathing, shortness of breath or sputum production. Skin: Pt reports rash. Denies ulcercations.    No other specific complaints in a complete review of systems (except as listed in HPI above).  Objective:   Physical Exam  BP 126/84   Pulse 85   Temp 98.1 F (36.7 C) (Oral)   Wt 216 lb (98 kg)   LMP 12/17/2011   SpO2 98%   BMI 32.84 kg/m  Wt Readings from Last 3 Encounters:  01/19/18 216 lb (98 kg)  11/19/17 216 lb (98 kg)  10/02/17 218 lb (98.9 kg)    General: Appears her stated age, cough in NAD. Skin: Vesicular rash on erythematous base noted on buttock. HEENT: Head: normal shape and size, no sinus tenderness noted;  Ears: Tm's gray and intact, normal light reflex; Nose: mucosa pink and moist, septum midline; Throat/Mouth: Teeth present, mucosa pink and moist, no exudate, lesions or ulcerations noted.  Neck:  No adenopathy noted. Pulmonary/Chest: Normal effort and positive vesicular breath sounds. No respiratory distress. No wheezes, rales or ronchi noted.    BMET    Component Value Date/Time   NA 138 10/13/2017 0535   K 4.0 10/13/2017 0535   CL 108 10/13/2017 0535   CO2 23 10/13/2017 0535   GLUCOSE 154 (H) 10/13/2017 0535   BUN 10 10/13/2017 0535   CREATININE 0.72 10/13/2017 0535   CREATININE 0.93 06/15/2015 1620   CALCIUM 8.8 (L) 10/13/2017 0535   GFRNONAA >60 10/13/2017 0535   GFRAA >60 10/13/2017 0535    Lipid Panel      Component Value Date/Time   CHOL 219 (H) 05/21/2017 1519   TRIG 127.0 05/21/2017 1519   HDL 66.90 05/21/2017 1519   CHOLHDL 3 05/21/2017 1519  VLDL 25.4 05/21/2017 1519   LDLCALC 127 (H) 05/21/2017 1519    CBC    Component Value Date/Time   WBC 15.5 (H) 10/13/2017 0535   RBC 3.80 (L) 10/13/2017 0535   HGB 11.1 (L) 10/13/2017 0535   HCT 34.7 (L) 10/13/2017 0535   PLT 294 10/13/2017 0535   MCV 91.3 10/13/2017 0535   MCH 29.2 10/13/2017 0535   MCHC 32.0 10/13/2017 0535   RDW 13.6 10/13/2017 0535   LYMPHSABS 2.4 10/02/2017 1524   MONOABS 0.4 10/02/2017 1524   EOSABS 0.0 10/02/2017 1524   BASOSABS 0.0 10/02/2017 1524    Hgb A1C Lab Results  Component Value Date   HGBA1C 5.6 09/27/2015            Assessment & Plan:   Shingles:  eRx for Valtrex 1 gm TID x 7 days Once this clears up, she will get her Shingrix vaccine Discussed suppressive therapy, but she wants to hold off at this time  Viral URI Cough:  No indication for abx or prednisone She has an Albuterol inhaler and will use every 4-6 hours as needed Start Mucinex 600 mg every 12 hours  Return precautions discussed Webb Silversmith, NP

## 2018-01-21 ENCOUNTER — Other Ambulatory Visit: Payer: Self-pay | Admitting: Internal Medicine

## 2018-01-21 MED ORDER — ALBUTEROL SULFATE 108 (90 BASE) MCG/ACT IN AEPB: 2.0000 | INHALATION_SPRAY | Freq: Four times a day (QID) | RESPIRATORY_TRACT | 0 refills | Status: AC | PRN

## 2018-01-21 NOTE — Telephone Encounter (Signed)
Copied from [place]. Topic: Inquiry >> Jan 21, 2018  3:56 PM Oliver Pila B wrote: Reason for CRM: pt called b/c she states her condition has gotten worse and Dr. Garnette Gunner told pt to call in for a Z pack  Pt is also requesting a refill on Albuterol Sulfate ([place]) 624 (90 Base) MCG/ACT AEPB [[phone]] Contact pt to advise

## 2018-01-21 NOTE — Telephone Encounter (Signed)
I spoke with pt;was on 01/19/18; pt is weak and fatigued, prod cough with brown phlegm; problems sleeping due to cough; taking OTC cough med. No fever. Pt requesting z pack to [place]; also pt requested refill albuterol proair respiclick done per 50/75/73 office note.

## 2018-01-22 MED ORDER — AZITHROMYCIN 250 MG PO TABS: ORAL_TABLET | ORAL | 0 refills | Status: AC

## 2018-01-22 MED ORDER — HYDROCODONE-HOMATROPINE 5-1.5 MG/5ML PO SYRP: 5.0000 mL | ORAL_SOLUTION | Freq: Three times a day (TID) | ORAL | 0 refills | Status: AC | PRN

## 2018-01-22 NOTE — Telephone Encounter (Signed)
[facility] pharmacy closes today at 5:30.

## 2018-01-22 NOTE — Addendum Note (Signed)
Addended by: Jearld Fenton on: 01/22/2018 11:53 AM   Modules accepted: Orders

## 2018-01-22 NOTE — Telephone Encounter (Signed)
°  Pt called back in and asking for the z-pak and a cough syrup to help her sleep.  . Pt. [place] away and would like to get it today, pharmacy closes at 5 too  Please call pt. And let her know when done.  [place]  [place] [address]  Phone: [phone] Fax: [phone]

## 2018-01-22 NOTE — Telephone Encounter (Signed)
Zpack and Hycodan sent to pharmacy

## 2018-01-22 NOTE — Telephone Encounter (Signed)
Pt notified done and pt voiced understanding.

## 2018-01-26 DIAGNOSIS — R05 Cough: Secondary | ICD-10-CM | POA: Diagnosis not present

## 2018-01-26 DIAGNOSIS — J209 Acute bronchitis, unspecified: Secondary | ICD-10-CM | POA: Diagnosis not present

## 2018-01-26 DIAGNOSIS — R918 Other nonspecific abnormal finding of lung field: Secondary | ICD-10-CM | POA: Diagnosis not present

## 2018-02-05 DIAGNOSIS — F419 Anxiety disorder, unspecified: Secondary | ICD-10-CM | POA: Diagnosis not present

## 2018-02-05 DIAGNOSIS — J4530 Mild persistent asthma, uncomplicated: Secondary | ICD-10-CM | POA: Diagnosis not present

## 2018-02-05 DIAGNOSIS — F329 Major depressive disorder, single episode, unspecified: Secondary | ICD-10-CM | POA: Diagnosis not present

## 2018-02-05 DIAGNOSIS — Z23 Encounter for immunization: Secondary | ICD-10-CM | POA: Diagnosis not present

## 2018-02-05 DIAGNOSIS — G43839 Menstrual migraine, intractable, without status migrainosus: Secondary | ICD-10-CM | POA: Diagnosis not present

## 2018-02-05 DIAGNOSIS — M1712 Unilateral primary osteoarthritis, left knee: Secondary | ICD-10-CM | POA: Diagnosis not present

## 2018-02-05 DIAGNOSIS — Z1211 Encounter for screening for malignant neoplasm of colon: Secondary | ICD-10-CM | POA: Diagnosis not present

## 2018-04-12 DIAGNOSIS — M1712 Unilateral primary osteoarthritis, left knee: Secondary | ICD-10-CM | POA: Diagnosis not present

## 2018-04-27 ENCOUNTER — Other Ambulatory Visit: Payer: Self-pay | Admitting: Internal Medicine

## 2018-05-07 DIAGNOSIS — K5730 Diverticulosis of large intestine without perforation or abscess without bleeding: Secondary | ICD-10-CM | POA: Diagnosis not present

## 2018-05-07 DIAGNOSIS — D120 Benign neoplasm of cecum: Secondary | ICD-10-CM | POA: Diagnosis not present

## 2018-05-07 DIAGNOSIS — Z1211 Encounter for screening for malignant neoplasm of colon: Secondary | ICD-10-CM | POA: Diagnosis not present

## 2018-05-07 DIAGNOSIS — K640 First degree hemorrhoids: Secondary | ICD-10-CM | POA: Diagnosis not present

## 2018-05-07 DIAGNOSIS — K633 Ulcer of intestine: Secondary | ICD-10-CM | POA: Diagnosis not present

## 2018-06-04 DIAGNOSIS — K439 Ventral hernia without obstruction or gangrene: Secondary | ICD-10-CM | POA: Diagnosis not present

## 2018-06-04 DIAGNOSIS — F329 Major depressive disorder, single episode, unspecified: Secondary | ICD-10-CM | POA: Diagnosis not present

## 2018-06-04 DIAGNOSIS — J4530 Mild persistent asthma, uncomplicated: Secondary | ICD-10-CM | POA: Diagnosis not present

## 2018-06-04 DIAGNOSIS — Z01419 Encounter for gynecological examination (general) (routine) without abnormal findings: Secondary | ICD-10-CM | POA: Diagnosis not present

## 2018-06-04 DIAGNOSIS — Z23 Encounter for immunization: Secondary | ICD-10-CM | POA: Diagnosis not present

## 2018-06-04 DIAGNOSIS — F419 Anxiety disorder, unspecified: Secondary | ICD-10-CM | POA: Diagnosis not present

## 2018-06-08 DIAGNOSIS — G4733 Obstructive sleep apnea (adult) (pediatric): Secondary | ICD-10-CM | POA: Diagnosis not present

## 2018-06-15 DIAGNOSIS — R21 Rash and other nonspecific skin eruption: Secondary | ICD-10-CM | POA: Diagnosis not present

## 2018-06-16 DIAGNOSIS — F331 Major depressive disorder, recurrent, moderate: Secondary | ICD-10-CM | POA: Diagnosis not present

## 2018-06-16 DIAGNOSIS — F411 Generalized anxiety disorder: Secondary | ICD-10-CM | POA: Diagnosis not present

## 2018-07-20 MED FILL — clonazePAM 1 MG TABS: 1 | 30 days supply | Qty: 60 | Fill #0

## 2018-07-20 MED FILL — busPIRone HCL 5 MG TABS: 5 | 30 days supply | Qty: 120 | Fill #0

## 2018-07-20 MED FILL — SHIPPING COST: 1 days supply | Qty: 1 | Fill #0

## 2018-07-20 MED FILL — ADVAIR 100/50 DISKUS: 100-50 | 90 days supply | Qty: 180 | Fill #0

## 2018-07-20 MED FILL — OMEPRAZOLE 20 MG CPDR: 20 | 90 days supply | Qty: 90 | Fill #0

## 2018-07-20 MED FILL — ARIPiprazole 2 MG TABS: 2 | 30 days supply | Qty: 30 | Fill #0

## 2018-07-27 DIAGNOSIS — H04123 Dry eye syndrome of bilateral lacrimal glands: Secondary | ICD-10-CM | POA: Diagnosis not present

## 2018-07-27 DIAGNOSIS — H524 Presbyopia: Secondary | ICD-10-CM | POA: Diagnosis not present

## 2018-07-27 DIAGNOSIS — H52223 Regular astigmatism, bilateral: Secondary | ICD-10-CM | POA: Diagnosis not present

## 2018-07-27 DIAGNOSIS — H5213 Myopia, bilateral: Secondary | ICD-10-CM | POA: Diagnosis not present

## 2018-07-29 DIAGNOSIS — L821 Other seborrheic keratosis: Secondary | ICD-10-CM | POA: Diagnosis not present

## 2018-07-29 DIAGNOSIS — L720 Epidermal cyst: Secondary | ICD-10-CM | POA: Diagnosis not present

## 2018-08-17 DIAGNOSIS — F331 Major depressive disorder, recurrent, moderate: Secondary | ICD-10-CM | POA: Diagnosis not present

## 2018-08-17 DIAGNOSIS — F419 Anxiety disorder, unspecified: Secondary | ICD-10-CM | POA: Diagnosis not present

## 2018-08-18 MED FILL — clonazePAM 1 MG TABS: 1 | 30 days supply | Qty: 60 | Fill #0

## 2018-08-18 MED FILL — DESVENLAFAXINE SUC ER 100 M: 100 | 30 days supply | Qty: 30 | Fill #0

## 2018-08-18 MED FILL — SHIPPING COST: 1 days supply | Qty: 1 | Fill #1

## 2018-08-24 MED FILL — SHIPPING COST: 1 days supply | Qty: 1 | Fill #2

## 2018-08-24 MED FILL — ARIPiprazole 2 MG TABS: 2 | 30 days supply | Qty: 30 | Fill #0

## 2018-08-26 DIAGNOSIS — H0265 Xanthelasma of left lower eyelid: Secondary | ICD-10-CM | POA: Diagnosis not present

## 2021-07-02 ENCOUNTER — Telehealth: Payer: Self-pay | Admitting: Internal Medicine

## 2021-07-02 NOTE — Telephone Encounter (Signed)
Called and spoke with pt and she stated that she is now living on the other side of [place] and she will be setting up with a new provider for her cpap and supplies.  They are requesting that a copy of the sleep study that was done in 2015 be sent.  Pt is wanting this to be mailed to her.   Her new address has been verified.  CY please advise. Thanks

## 2021-07-03 NOTE — Telephone Encounter (Signed)
Please sendd sleep study report as requested

## 2021-07-03 NOTE — Telephone Encounter (Signed)
HST results printed and placed in sealed envelope for Patient.  Patient requested HST results be mailed to her.  Address verified.  Nothing further at this time.
